# Patient Record
Sex: Female | Born: 1970 | Race: Black or African American | Hispanic: No | Marital: Married | Smoking: Current every day smoker
Health system: Southern US, Community
[De-identification: ages and names within clinical notes are randomized; demographics above are authoritative.]

## PROBLEM LIST (undated history)

## (undated) DIAGNOSIS — K760 Fatty (change of) liver, not elsewhere classified: Secondary | ICD-10-CM

## (undated) DIAGNOSIS — I10 Essential (primary) hypertension: Secondary | ICD-10-CM

## (undated) DIAGNOSIS — K219 Gastro-esophageal reflux disease without esophagitis: Secondary | ICD-10-CM

## (undated) DIAGNOSIS — C801 Malignant (primary) neoplasm, unspecified: Secondary | ICD-10-CM

## (undated) HISTORY — PX: DENTAL SURGERY: SHX609

## (undated) HISTORY — PX: FOOT SURGERY: SHX648

---

## 2000-03-14 ENCOUNTER — Emergency Department (HOSPITAL_COMMUNITY): Admission: EM | Admit: 2000-03-14 | Discharge: 2000-03-14 | Payer: Self-pay | Admitting: Emergency Medicine

## 2000-03-14 ENCOUNTER — Encounter: Payer: Self-pay | Admitting: Emergency Medicine

## 2000-03-19 ENCOUNTER — Other Ambulatory Visit: Admission: RE | Admit: 2000-03-19 | Discharge: 2000-03-19 | Payer: Self-pay | Admitting: Obstetrics

## 2000-03-19 ENCOUNTER — Encounter (INDEPENDENT_AMBULATORY_CARE_PROVIDER_SITE_OTHER): Payer: Self-pay

## 2000-03-28 ENCOUNTER — Emergency Department (HOSPITAL_COMMUNITY): Admission: EM | Admit: 2000-03-28 | Discharge: 2000-03-28 | Payer: Self-pay | Admitting: Emergency Medicine

## 2000-04-13 ENCOUNTER — Emergency Department (HOSPITAL_COMMUNITY): Admission: EM | Admit: 2000-04-13 | Discharge: 2000-04-13 | Payer: Self-pay | Admitting: Emergency Medicine

## 2001-01-03 ENCOUNTER — Emergency Department (HOSPITAL_COMMUNITY): Admission: EM | Admit: 2001-01-03 | Discharge: 2001-01-03 | Payer: Self-pay | Admitting: Emergency Medicine

## 2001-11-13 ENCOUNTER — Emergency Department (HOSPITAL_COMMUNITY): Admission: EM | Admit: 2001-11-13 | Discharge: 2001-11-14 | Payer: Self-pay | Admitting: Emergency Medicine

## 2001-11-14 ENCOUNTER — Encounter: Payer: Self-pay | Admitting: Emergency Medicine

## 2004-05-29 ENCOUNTER — Emergency Department (HOSPITAL_COMMUNITY): Admission: EM | Admit: 2004-05-29 | Discharge: 2004-05-29 | Payer: Self-pay | Admitting: Emergency Medicine

## 2006-03-16 ENCOUNTER — Emergency Department (HOSPITAL_COMMUNITY): Admission: EM | Admit: 2006-03-16 | Discharge: 2006-03-16 | Payer: Self-pay | Admitting: Emergency Medicine

## 2007-02-22 ENCOUNTER — Ambulatory Visit: Payer: Self-pay | Admitting: Family Medicine

## 2007-02-26 ENCOUNTER — Ambulatory Visit: Payer: Self-pay | Admitting: *Deleted

## 2007-03-07 ENCOUNTER — Ambulatory Visit: Payer: Self-pay | Admitting: Family Medicine

## 2007-03-25 ENCOUNTER — Ambulatory Visit: Payer: Self-pay | Admitting: Family Medicine

## 2007-07-15 ENCOUNTER — Ambulatory Visit: Payer: Self-pay | Admitting: Family Medicine

## 2007-07-23 ENCOUNTER — Ambulatory Visit (HOSPITAL_COMMUNITY): Admission: RE | Admit: 2007-07-23 | Discharge: 2007-07-23 | Payer: Self-pay | Admitting: Family Medicine

## 2007-09-09 ENCOUNTER — Ambulatory Visit: Payer: Self-pay | Admitting: Family Medicine

## 2007-09-09 LAB — CONVERTED CEMR LAB
Cholesterol: 244 mg/dL — ABNORMAL HIGH (ref 0–200)
Free T4: 0.94 ng/dL (ref 0.89–1.80)
HDL: 44 mg/dL (ref >39)

## 2007-10-25 ENCOUNTER — Ambulatory Visit: Payer: Self-pay | Admitting: Family Medicine

## 2007-11-04 ENCOUNTER — Ambulatory Visit (HOSPITAL_COMMUNITY): Admission: RE | Admit: 2007-11-04 | Discharge: 2007-11-04 | Payer: Self-pay | Admitting: Family Medicine

## 2007-12-10 ENCOUNTER — Encounter: Admission: RE | Admit: 2007-12-10 | Discharge: 2007-12-10 | Payer: Self-pay | Admitting: Family Medicine

## 2007-12-10 ENCOUNTER — Other Ambulatory Visit: Admission: RE | Admit: 2007-12-10 | Discharge: 2007-12-10 | Payer: Self-pay | Admitting: Diagnostic Radiology

## 2007-12-10 ENCOUNTER — Encounter (INDEPENDENT_AMBULATORY_CARE_PROVIDER_SITE_OTHER): Payer: Self-pay | Admitting: Diagnostic Radiology

## 2007-12-27 ENCOUNTER — Ambulatory Visit: Payer: Self-pay | Admitting: Family Medicine

## 2008-01-28 ENCOUNTER — Encounter (INDEPENDENT_AMBULATORY_CARE_PROVIDER_SITE_OTHER): Payer: Self-pay | Admitting: Family Medicine

## 2008-01-28 ENCOUNTER — Ambulatory Visit: Payer: Self-pay | Admitting: Internal Medicine

## 2008-01-28 LAB — CONVERTED CEMR LAB
CO2: 22 meq/L (ref 19–32)
Calcium: 9.5 mg/dL (ref 8.4–10.5)
Chloride: 105 meq/L (ref 96–112)
Cholesterol: 233 mg/dL — ABNORMAL HIGH (ref 0–200)
Free T4: 0.96 ng/dL (ref 0.89–1.80)
HDL: 34 mg/dL — ABNORMAL LOW (ref >39)
Potassium: 4.3 meq/L (ref 3.5–5.3)
Sodium: 138 meq/L (ref 135–145)
T3 Uptake Ratio: 28.6 % (ref 22.5–37.0)
T4, Total: 9.1 ug/dL (ref 5.0–12.5)

## 2008-02-27 ENCOUNTER — Ambulatory Visit: Payer: Self-pay | Admitting: Internal Medicine

## 2008-04-23 ENCOUNTER — Ambulatory Visit: Payer: Self-pay | Admitting: Internal Medicine

## 2008-04-23 ENCOUNTER — Encounter (INDEPENDENT_AMBULATORY_CARE_PROVIDER_SITE_OTHER): Payer: Self-pay | Admitting: Family Medicine

## 2008-04-23 LAB — CONVERTED CEMR LAB
ALT: 45 units/L — ABNORMAL HIGH (ref 0–35)
Cholesterol: 254 mg/dL — ABNORMAL HIGH (ref 0–200)
Free T4: 1.13 ng/dL (ref 0.89–1.80)
HDL: 38 mg/dL — ABNORMAL LOW (ref >39)
TSH: 0.866 microintl units/mL (ref 0.350–4.50)
Total CHOL/HDL Ratio: 6.7
Triglycerides: 647 mg/dL — ABNORMAL HIGH (ref <150)

## 2008-05-26 ENCOUNTER — Ambulatory Visit: Payer: Self-pay | Admitting: Internal Medicine

## 2008-06-02 ENCOUNTER — Ambulatory Visit: Payer: Self-pay | Admitting: Internal Medicine

## 2008-06-02 ENCOUNTER — Encounter (INDEPENDENT_AMBULATORY_CARE_PROVIDER_SITE_OTHER): Payer: Self-pay | Admitting: *Deleted

## 2008-06-02 LAB — CONVERTED CEMR LAB: Vit D, 1,25-Dihydroxy: 18 — ABNORMAL LOW (ref 30–89)

## 2008-06-08 ENCOUNTER — Ambulatory Visit (HOSPITAL_COMMUNITY): Admission: RE | Admit: 2008-06-08 | Discharge: 2008-06-08 | Payer: Self-pay | Admitting: Family Medicine

## 2008-06-19 ENCOUNTER — Ambulatory Visit: Payer: Self-pay | Admitting: Family Medicine

## 2008-07-23 ENCOUNTER — Ambulatory Visit (HOSPITAL_COMMUNITY): Admission: RE | Admit: 2008-07-23 | Discharge: 2008-07-23 | Payer: Self-pay | Admitting: Family Medicine

## 2008-11-26 ENCOUNTER — Ambulatory Visit: Payer: Self-pay | Admitting: Family Medicine

## 2009-03-02 ENCOUNTER — Ambulatory Visit: Payer: Self-pay | Admitting: Family Medicine

## 2009-03-02 ENCOUNTER — Encounter (INDEPENDENT_AMBULATORY_CARE_PROVIDER_SITE_OTHER): Payer: Self-pay | Admitting: Family Medicine

## 2009-04-06 ENCOUNTER — Ambulatory Visit: Payer: Self-pay | Admitting: Family Medicine

## 2009-04-08 ENCOUNTER — Ambulatory Visit: Payer: Self-pay | Admitting: Internal Medicine

## 2009-04-22 ENCOUNTER — Ambulatory Visit: Payer: Self-pay | Admitting: Family Medicine

## 2009-04-28 ENCOUNTER — Ambulatory Visit: Payer: Self-pay | Admitting: Family Medicine

## 2009-07-26 ENCOUNTER — Ambulatory Visit (HOSPITAL_COMMUNITY): Admission: RE | Admit: 2009-07-26 | Discharge: 2009-07-26 | Payer: Self-pay | Admitting: Family Medicine

## 2009-08-17 ENCOUNTER — Encounter (INDEPENDENT_AMBULATORY_CARE_PROVIDER_SITE_OTHER): Payer: Self-pay | Admitting: Family Medicine

## 2009-08-17 ENCOUNTER — Ambulatory Visit: Payer: Self-pay | Admitting: Internal Medicine

## 2009-08-17 LAB — CONVERTED CEMR LAB
Cholesterol: 267 mg/dL — ABNORMAL HIGH (ref 0–200)
HDL: 44 mg/dL (ref >39)

## 2010-07-27 ENCOUNTER — Ambulatory Visit (HOSPITAL_COMMUNITY): Admission: RE | Admit: 2010-07-27 | Discharge: 2010-07-27 | Payer: Self-pay | Admitting: Internal Medicine

## 2010-09-05 ENCOUNTER — Emergency Department (HOSPITAL_COMMUNITY): Admission: EM | Admit: 2010-09-05 | Discharge: 2010-09-05 | Payer: Self-pay | Admitting: Emergency Medicine

## 2010-11-06 ENCOUNTER — Encounter: Payer: Self-pay | Admitting: Family Medicine

## 2010-12-07 ENCOUNTER — Ambulatory Visit: Payer: Medicaid Other | Attending: Rehabilitation | Admitting: Rehabilitation

## 2010-12-07 DIAGNOSIS — M545 Low back pain, unspecified: Secondary | ICD-10-CM | POA: Insufficient documentation

## 2010-12-07 DIAGNOSIS — R5381 Other malaise: Secondary | ICD-10-CM | POA: Insufficient documentation

## 2010-12-07 DIAGNOSIS — R293 Abnormal posture: Secondary | ICD-10-CM | POA: Insufficient documentation

## 2010-12-07 DIAGNOSIS — IMO0001 Reserved for inherently not codable concepts without codable children: Secondary | ICD-10-CM | POA: Insufficient documentation

## 2010-12-14 ENCOUNTER — Encounter: Payer: Medicaid Other | Admitting: Rehabilitation

## 2010-12-20 ENCOUNTER — Ambulatory Visit: Payer: Medicaid Other | Attending: Rehabilitation | Admitting: Rehabilitation

## 2010-12-20 DIAGNOSIS — IMO0001 Reserved for inherently not codable concepts without codable children: Secondary | ICD-10-CM | POA: Insufficient documentation

## 2010-12-20 DIAGNOSIS — R5381 Other malaise: Secondary | ICD-10-CM | POA: Insufficient documentation

## 2010-12-20 DIAGNOSIS — M545 Low back pain, unspecified: Secondary | ICD-10-CM | POA: Insufficient documentation

## 2010-12-20 DIAGNOSIS — R293 Abnormal posture: Secondary | ICD-10-CM | POA: Insufficient documentation

## 2010-12-27 ENCOUNTER — Ambulatory Visit: Payer: Medicaid Other | Admitting: Rehabilitation

## 2011-01-04 ENCOUNTER — Encounter: Payer: Medicaid Other | Admitting: Rehabilitation

## 2011-01-11 ENCOUNTER — Ambulatory Visit: Payer: Medicaid Other | Admitting: Rehabilitation

## 2011-01-24 ENCOUNTER — Encounter: Payer: Medicaid Other | Admitting: Rehabilitation

## 2011-08-07 ENCOUNTER — Other Ambulatory Visit (HOSPITAL_COMMUNITY): Payer: Self-pay | Admitting: Internal Medicine

## 2011-08-07 DIAGNOSIS — Z1231 Encounter for screening mammogram for malignant neoplasm of breast: Secondary | ICD-10-CM

## 2011-08-25 ENCOUNTER — Ambulatory Visit (HOSPITAL_COMMUNITY): Payer: Medicaid Other | Attending: Internal Medicine

## 2011-09-19 ENCOUNTER — Other Ambulatory Visit: Payer: Self-pay | Admitting: Rehabilitation

## 2011-09-19 DIAGNOSIS — M545 Low back pain, unspecified: Secondary | ICD-10-CM

## 2011-09-23 ENCOUNTER — Ambulatory Visit
Admission: RE | Admit: 2011-09-23 | Discharge: 2011-09-23 | Disposition: A | Discharge status: Home / Self Care | Payer: Medicaid Other | Source: Ambulatory Visit | Attending: Rehabilitation | Admitting: Rehabilitation

## 2011-09-23 DIAGNOSIS — M545 Low back pain: Secondary | ICD-10-CM

## 2011-11-06 ENCOUNTER — Encounter (HOSPITAL_COMMUNITY): Payer: Self-pay | Admitting: *Deleted

## 2011-11-06 ENCOUNTER — Emergency Department (HOSPITAL_COMMUNITY)
Admission: EM | Admit: 2011-11-06 | Discharge: 2011-11-07 | Disposition: A | Discharge status: Home / Self Care | Payer: No Typology Code available for payment source | Attending: Emergency Medicine | Admitting: Emergency Medicine

## 2011-11-06 ENCOUNTER — Emergency Department (HOSPITAL_COMMUNITY): Payer: No Typology Code available for payment source

## 2011-11-06 DIAGNOSIS — T148XXA Other injury of unspecified body region, initial encounter: Secondary | ICD-10-CM

## 2011-11-06 DIAGNOSIS — S161XXA Strain of muscle, fascia and tendon at neck level, initial encounter: Secondary | ICD-10-CM

## 2011-11-06 DIAGNOSIS — E119 Type 2 diabetes mellitus without complications: Secondary | ICD-10-CM | POA: Insufficient documentation

## 2011-11-06 DIAGNOSIS — S139XXA Sprain of joints and ligaments of unspecified parts of neck, initial encounter: Secondary | ICD-10-CM | POA: Insufficient documentation

## 2011-11-06 DIAGNOSIS — M546 Pain in thoracic spine: Secondary | ICD-10-CM | POA: Insufficient documentation

## 2011-11-06 DIAGNOSIS — M25519 Pain in unspecified shoulder: Secondary | ICD-10-CM | POA: Insufficient documentation

## 2011-11-06 DIAGNOSIS — Z79899 Other long term (current) drug therapy: Secondary | ICD-10-CM | POA: Insufficient documentation

## 2011-11-06 DIAGNOSIS — M542 Cervicalgia: Secondary | ICD-10-CM | POA: Insufficient documentation

## 2011-11-06 DIAGNOSIS — I10 Essential (primary) hypertension: Secondary | ICD-10-CM | POA: Insufficient documentation

## 2011-11-06 HISTORY — DX: Essential (primary) hypertension: I10
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
## 2011-11-07 MED ORDER — CYCLOBENZAPRINE HCL 10 MG PO TABS: 10.0000 mg | ORAL_TABLET | Freq: Three times a day (TID) | ORAL | Status: AC | PRN

## 2011-11-07 NOTE — ED Provider Notes (Signed)
Medical screening examination/treatment/procedure(s) were performed by non-physician practitioner and as supervising physician I was immediately available for consultation/collaboration.   Kember Boch D Priscella Donna, MD 11/07/11 2315 

## 2011-11-07 NOTE — ED Notes (Signed)
Pt. Alert and oriented, ambulatory, gait steady, NAD noted,

## 2012-01-25 ENCOUNTER — Ambulatory Visit (HOSPITAL_COMMUNITY)
Admission: RE | Admit: 2012-01-25 | Discharge: 2012-01-25 | Disposition: A | Discharge status: Home / Self Care | Payer: Medicaid Other | Source: Ambulatory Visit | Attending: Internal Medicine | Admitting: Internal Medicine

## 2012-01-25 DIAGNOSIS — Z1231 Encounter for screening mammogram for malignant neoplasm of breast: Secondary | ICD-10-CM | POA: Insufficient documentation

## 2012-02-05 ENCOUNTER — Other Ambulatory Visit: Payer: Self-pay | Admitting: Internal Medicine

## 2012-02-09 ENCOUNTER — Other Ambulatory Visit: Payer: No Typology Code available for payment source

## 2012-03-04 ENCOUNTER — Ambulatory Visit
Admission: RE | Admit: 2012-03-04 | Discharge: 2012-03-04 | Disposition: A | Discharge status: Home / Self Care | Payer: Medicaid Other | Source: Ambulatory Visit | Attending: Internal Medicine | Admitting: Internal Medicine

## 2012-08-01 ENCOUNTER — Other Ambulatory Visit: Payer: Self-pay | Admitting: Internal Medicine

## 2012-08-01 DIAGNOSIS — K3189 Other diseases of stomach and duodenum: Secondary | ICD-10-CM

## 2012-08-07 ENCOUNTER — Ambulatory Visit
Admission: RE | Admit: 2012-08-07 | Discharge: 2012-08-07 | Disposition: A | Discharge status: Home / Self Care | Payer: Medicaid Other | Source: Ambulatory Visit | Attending: Internal Medicine | Admitting: Internal Medicine

## 2012-08-07 DIAGNOSIS — K3189 Other diseases of stomach and duodenum: Secondary | ICD-10-CM

## 2012-12-15 ENCOUNTER — Emergency Department (HOSPITAL_COMMUNITY): Payer: Medicaid Other

## 2012-12-15 ENCOUNTER — Emergency Department (HOSPITAL_COMMUNITY)
Admission: EM | Admit: 2012-12-15 | Discharge: 2012-12-15 | Disposition: A | Discharge status: Home / Self Care | Payer: Medicaid Other | Attending: Emergency Medicine | Admitting: Emergency Medicine

## 2012-12-15 ENCOUNTER — Encounter (HOSPITAL_COMMUNITY): Payer: Self-pay | Admitting: *Deleted

## 2012-12-15 DIAGNOSIS — R42 Dizziness and giddiness: Secondary | ICD-10-CM | POA: Insufficient documentation

## 2012-12-15 DIAGNOSIS — I10 Essential (primary) hypertension: Secondary | ICD-10-CM | POA: Insufficient documentation

## 2012-12-15 DIAGNOSIS — R1013 Epigastric pain: Secondary | ICD-10-CM | POA: Insufficient documentation

## 2012-12-15 DIAGNOSIS — Z8619 Personal history of other infectious and parasitic diseases: Secondary | ICD-10-CM | POA: Insufficient documentation

## 2012-12-15 DIAGNOSIS — R000 Tachycardia, unspecified: Secondary | ICD-10-CM | POA: Insufficient documentation

## 2012-12-15 DIAGNOSIS — K219 Gastro-esophageal reflux disease without esophagitis: Secondary | ICD-10-CM | POA: Insufficient documentation

## 2012-12-15 DIAGNOSIS — R112 Nausea with vomiting, unspecified: Secondary | ICD-10-CM | POA: Insufficient documentation

## 2012-12-15 DIAGNOSIS — Z79899 Other long term (current) drug therapy: Secondary | ICD-10-CM | POA: Insufficient documentation

## 2012-12-15 DIAGNOSIS — R1110 Vomiting, unspecified: Secondary | ICD-10-CM

## 2012-12-15 DIAGNOSIS — Z8719 Personal history of other diseases of the digestive system: Secondary | ICD-10-CM | POA: Insufficient documentation

## 2012-12-15 DIAGNOSIS — R197 Diarrhea, unspecified: Secondary | ICD-10-CM | POA: Insufficient documentation

## 2012-12-15 DIAGNOSIS — R51 Headache: Secondary | ICD-10-CM | POA: Insufficient documentation

## 2012-12-15 HISTORY — DX: Gastro-esophageal reflux disease without esophagitis: K21.9

## 2012-12-15 HISTORY — DX: Fatty (change of) liver, not elsewhere classified: K76.0

## 2012-12-15 LAB — URINALYSIS, ROUTINE W REFLEX MICROSCOPIC
Ketones, ur: NEGATIVE mg/dL
Nitrite: NEGATIVE
Protein, ur: NEGATIVE mg/dL
Urobilinogen, UA: 0.2 mg/dL (ref 0.0–1.0)

## 2012-12-15 LAB — POCT I-STAT, CHEM 8
Calcium, Ion: 1.17 mmol/L (ref 1.12–1.23)
Glucose, Bld: 200 mg/dL — ABNORMAL HIGH (ref 70–99)
Sodium: 139 mEq/L (ref 135–145)

## 2012-12-15 LAB — COMPREHENSIVE METABOLIC PANEL
ALT: 56 U/L — ABNORMAL HIGH (ref 0–35)
AST: 53 U/L — ABNORMAL HIGH (ref 0–37)
CO2: 22 mEq/L (ref 19–32)
Calcium: 9.6 mg/dL (ref 8.4–10.5)
Chloride: 101 mEq/L (ref 96–112)
GFR calc non Af Amer: >90 mL/min (ref >90)
Potassium: 3.7 mEq/L (ref 3.5–5.1)
Sodium: 137 mEq/L (ref 135–145)
Total Bilirubin: 0.7 mg/dL (ref 0.3–1.2)

## 2012-12-15 LAB — CBC WITH DIFFERENTIAL/PLATELET
Eosinophils Relative: 0 % (ref 0–5)
Lymphocytes Relative: 11 % — ABNORMAL LOW (ref 12–46)
Monocytes Relative: 4 % (ref 3–12)
Neutrophils Relative %: 85 % — ABNORMAL HIGH (ref 43–77)
Platelets: 229 10*3/uL (ref 150–400)
RBC: 5.27 MIL/uL — ABNORMAL HIGH (ref 3.87–5.11)
WBC: 10.4 10*3/uL (ref 4.0–10.5)

## 2012-12-15 MED ORDER — ONDANSETRON HCL 4 MG PO TABS: 4.0000 mg | ORAL_TABLET | Freq: Three times a day (TID) | ORAL | Status: DC | PRN

## 2012-12-15 MED ORDER — MORPHINE SULFATE 4 MG/ML IJ SOLN
4.0000 mg | Freq: Once | INTRAMUSCULAR | Status: AC
  Administered 2012-12-15: 4 mg via INTRAVENOUS
  Filled 2012-12-15: qty 1

## 2012-12-15 MED ORDER — ONDANSETRON 4 MG PO TBDP
4.0000 mg | ORAL_TABLET | Freq: Once | ORAL | Status: AC
  Administered 2012-12-15: 4 mg via ORAL
  Filled 2012-12-15: qty 1

## 2012-12-15 MED ORDER — SODIUM CHLORIDE 0.9 % IV BOLUS (SEPSIS)
1000.0000 mL | Freq: Once | INTRAVENOUS | Status: AC
  Administered 2012-12-15: 1000 mL via INTRAVENOUS

## 2012-12-15 NOTE — ED Notes (Signed)
Pt reports that she started vomiting and having diarrhea this morning.  She also reports abdominal pain.  She is a type 2 diabetic.  She is also concerned that she could have a kidney infection.  Pt was started on medication for H pyloris and the symptoms started shortly after she took the medication.

## 2012-12-15 NOTE — ED Notes (Signed)
Pt checked children into pediatric dept and will come back to be seen after her children are seen

## 2012-12-15 NOTE — ED Provider Notes (Signed)
History  This chart was scribed for non-physician practitioner working with Ethelda Chick, MD by Erskine Emery, ED Scribe. This patient was seen in room TR09C/TR09C and the patient's care was started at 18:05.   CSN: [id number]  Arrival date & time 12/15/12  1550   First MD Initiated Contact with Patient 12/15/12 1625      Chief Complaint  Patient presents with  . Abdominal Pain  . Emesis  . Diarrhea    (Consider location/radiation/quality/duration/timing/severity/associated sxs/prior treatment) The history is provided by the patient. No language interpreter was used.  Hayley Collins is a 42 y.o. female who presents to the Emergency Department complaining of emesis (3 episodes) and diarrhea (about 3 episodes) since this morning. Pt reports some associated nausea, epigastric abdominal pain, dizziness, lightheadedness, and frontal headache. Pt claims she is in about 11-12/10 pain currently. Pt's entire family has been sick with the same stomach virus this week. Pt was started on a new medication for H pylori about 1.5 weeks ago. Just a couple days ago she missed 24 hours of her medication, took one dose again, then 30 minutes later started having symptoms, which she has had ever since. Pt is a type II diabetic and is concerned about a kidney infection. She has not been taking her metformin for the past 3 weeks. Pt tested for H pylori by Dr. Ronaldo Miyamoto in November but did not find out that she had been diagnosed with it until she saw her PCP for a routine check up 1.5 weeks ago. Pt has also been diagnosed with HTN, GERD, and a fatty liver.   Past Medical History  Diagnosis Date  . Diabetes mellitus   . Hypertension   . GERD (gastroesophageal reflux disease)   . Fatty liver     History reviewed. No pertinent past surgical history.  History reviewed. No pertinent family history.  History  Substance Use Topics  . Smoking status: Never Smoker   . Smokeless tobacco: Not on file  .  Alcohol Use: No    OB History   Grav Para Term Preterm Abortions TAB SAB Ect Mult Living                  Review of Systems  Constitutional: Negative for fever and diaphoresis.  HENT: Negative for neck pain and neck stiffness.   Eyes: Negative for visual disturbance.  Respiratory: Negative for apnea and chest tightness.   Cardiovascular: Negative for palpitations.  Gastrointestinal: Positive for nausea, vomiting, abdominal pain and diarrhea.       Diffuse epigastric  Endocrine: Negative for polydipsia, polyphagia and polyuria.  Genitourinary: Negative for dysuria, hematuria, flank pain, decreased urine volume, vaginal discharge, difficulty urinating and pelvic pain.  Musculoskeletal: Negative for gait problem.  Skin: Negative for rash.  Neurological: Positive for dizziness, light-headedness and headaches. Negative for weakness and numbness.  All other systems reviewed and are negative.    Allergies  Review of patient's allergies indicates no known allergies.  Home Medications   Current Outpatient Rx  Name  Route  Sig  Dispense  Refill  . amoxicillin-clarithromycin-lansoprazole  [facility]) combo pack   Oral   Take 1 each by mouth 2 (two) times daily. Follow package directions.         . cholecalciferol (VITAMIN D) 1000 UNITS tablet   Oral   Take 1,000 Units by mouth daily.         . fish oil-omega-3 fatty acids 1000 MG capsule   Oral   Take 1  g by mouth daily.         Marland Kitchen gemfibrozil (LOPID) 600 MG tablet   Oral   Take 600 mg by mouth daily.         Marland Kitchen lisinopril (PRINIVIL,ZESTRIL) 5 MG tablet   Oral   Take 5 mg by mouth daily.         . metFORMIN (GLUCOPHAGE) 500 MG tablet   Oral   Take 1,000 mg by mouth daily with breakfast.         . omeprazole (PRILOSEC) 20 MG capsule   Oral   Take 20 mg by mouth daily.           Triage Vitals: BP 142/83  Pulse 112  Temp(Src) 98.4 F (36.9 C)  Resp 20  SpO2 100%  Physical Exam  Nursing note and vitals  reviewed. Constitutional: She is oriented to person, place, and time. She appears well-developed and well-nourished. No distress.  HENT:  Head: Normocephalic and atraumatic.  Eyes: Conjunctivae and EOM are normal.  No icterus  Neck: Normal range of motion. Neck supple.  No meningeal signs  Cardiovascular: Regular rhythm, normal heart sounds and intact distal pulses.  Tachycardia present.  Exam reveals no gallop and no friction rub.   No murmur heard. Pulmonary/Chest: Effort normal and breath sounds normal. No respiratory distress. She has no wheezes. She has no rales. She exhibits no tenderness.  Borderline tachypnea.   Abdominal: Soft. Bowel sounds are normal. She exhibits no distension. There is tenderness in the epigastric area. There is no rebound, no guarding, no tenderness at McBurney's point and negative Murphy's sign.  Diffuse epigastric tenderness. Not worsened by deep palplation  Genitourinary:  No CVA tenderness  Musculoskeletal: Normal range of motion. She exhibits no edema and no tenderness.  Neurological: She is alert and oriented to person, place, and time. No cranial nerve deficit.  Skin: Skin is warm and dry. She is not diaphoretic. No erythema.    ED Course  Procedures (including critical care time) DIAGNOSTIC STUDIES: Oxygen Saturation is 100% on room air, normal by my interpretation.    COORDINATION OF CARE: 18:05--I evaluated the patient and we discussed a treatment plan including antiemetics and consult with another physician to which the pt agreed. I notified the pt that she is tachycardiac, tachypneic, and has a urinary glucose level over 1000.  22:16--I rechecked the pt and discussed with her the results of her labs. I explained to her the importance of following up with her PCP.  Results for orders placed during the hospital encounter of 12/15/12  URINALYSIS, ROUTINE W REFLEX MICROSCOPIC      Result Value Range   Color, Urine YELLOW  YELLOW   APPearance  CLEAR  CLEAR   Specific Gravity, Urine 1.035 (*) 1.005 - 1.030   pH 5.0  5.0 - 8.0   Glucose, UA >1000 (*) NEGATIVE mg/dL   Hgb urine dipstick NEGATIVE  NEGATIVE   Bilirubin Urine NEGATIVE  NEGATIVE   Ketones, ur NEGATIVE  NEGATIVE mg/dL   Protein, ur NEGATIVE  NEGATIVE mg/dL   Urobilinogen, UA 0.2  0.0 - 1.0 mg/dL   Nitrite NEGATIVE  NEGATIVE   Leukocytes, UA NEGATIVE  NEGATIVE  URINE MICROSCOPIC-ADD ON      Result Value Range   Urine-Other       Value: NO FORMED ELEMENTS SEEN ON URINE MICROSCOPIC EXAMINATION  CBC WITH DIFFERENTIAL      Result Value Range   WBC 10.4  4.0 - 10.5 K/uL  RBC 5.27 (*) 3.87 - 5.11 MIL/uL   Hemoglobin 12.7  12.0 - 15.0 g/dL   HCT 16.1  09.6 - 04.5 %   MCV 72.3 (*) 78.0 - 100.0 fL   MCH 24.1 (*) 26.0 - 34.0 pg   MCHC 33.3  30.0 - 36.0 g/dL   RDW 40.9  81.1 - 91.4 %   Platelets 229  150 - 400 K/uL   Neutrophils Relative 85 (*) 43 - 77 %   Lymphocytes Relative 11 (*) 12 - 46 %   Monocytes Relative 4  3 - 12 %   Eosinophils Relative 0  0 - 5 %   Basophils Relative 0  0 - 1 %   Neutro Abs 8.9 (*) 1.7 - 7.7 K/uL   Lymphs Abs 1.1  0.7 - 4.0 K/uL   Monocytes Absolute 0.4  0.1 - 1.0 K/uL   Eosinophils Absolute 0.0  0.0 - 0.7 K/uL   Basophils Absolute 0.0  0.0 - 0.1 K/uL   RBC Morphology POLYCHROMASIA PRESENT    COMPREHENSIVE METABOLIC PANEL      Result Value Range   Sodium 137  135 - 145 mEq/L   Potassium 3.7  3.5 - 5.1 mEq/L   Chloride 101  96 - 112 mEq/L   CO2 22  19 - 32 mEq/L   Glucose, Bld 184 (*) 70 - 99 mg/dL   BUN 12  6 - 23 mg/dL   Creatinine, Ser 7.82  0.50 - 1.10 mg/dL   Calcium 9.6  8.4 - 95.6 mg/dL   Total Protein 8.2  6.0 - 8.3 g/dL   Albumin 3.7  3.5 - 5.2 g/dL   AST 53 (*) 0 - 37 U/L   ALT 56 (*) 0 - 35 U/L   Alkaline Phosphatase 119 (*) 39 - 117 U/L   Total Bilirubin 0.7  0.3 - 1.2 mg/dL   GFR calc non Af Amer >90  >90 mL/min   GFR calc Af Amer >90  >90 mL/min  LIPASE, BLOOD      Result Value Range   Lipase 53  11 - 59 U/L   POCT I-STAT, CHEM 8      Result Value Range   Sodium 139  135 - 145 mEq/L   Potassium 3.8  3.5 - 5.1 mEq/L   Chloride 104  96 - 112 mEq/L   BUN 12  6 - 23 mg/dL   Creatinine, Ser 2.13  0.50 - 1.10 mg/dL   Glucose, Bld 086 (*) 70 - 99 mg/dL   Calcium, Ion 5.78  4.69 - 1.23 mmol/L   TCO2 26  0 - 100 mmol/L   Hemoglobin 15.6 (*) 12.0 - 15.0 g/dL   HCT 62.9  52.8 - 41.3 %   US Abdomen Complete  12/15/2012  *RADIOLOGY REPORT*  Clinical Data:  Abdominal pain and elevated LFTs.  Nausea, vomiting and diarrhea.  ABDOMINAL ULTRASOUND COMPLETE  Comparison:  Abdominal ultrasound performed 08/07/2012  Findings:  Gallbladder:  The gallbladder is normal in appearance, without evidence for gallstones, gallbladder wall thickening or pericholecystic fluid.  No ultrasonographic Murphy's sign is elicited.  Common Bile Duct:  0.6 cm in diameter; within normal limits in caliber.  Liver:  Increased parenchymal echogenicity and coarsened echotexture, compatible with fatty infiltration; no focal lesions identified.  Limited Doppler evaluation demonstrates normal blood flow within the liver.  IVC:  Unremarkable in appearance.  Pancreas:  Although the pancreas is difficult to visualize in its entirety due to overlying bowel gas, no focal  pancreatic abnormality is identified.  Spleen:  9.7 cm in length; within normal limits in size and echotexture.  Right kidney:  12.8 cm in length; normal in size, configuration and parenchymal echogenicity.  No evidence of mass or hydronephrosis.  Left kidney:  12.3 cm in length; normal in size, configuration and parenchymal echogenicity.  No evidence of mass or hydronephrosis.  Abdominal Aorta:  Normal in caliber; no aneurysm identified. Not visualized distally due to overlying bowel gas.  IMPRESSION:  1.  No acute abnormality seen within the abdomen. 2.  Diffuse fatty infiltration within the liver.   Original Report Authenticated By: Tonia Ghent, M.D.       Diagnosis: emesis    MDM   Pt was sent back from peds where she had brought her children who were diagnosed with gastroenteritis and discharged. Pt believed she had the same symptoms. In fast track, pt was found to be tachycardic, borderline tachypnic, with >1000 glucose in her urine (serum glucose of 200) and hx of non compliant DM2 and fatty liver. Consulted Dr. Karma Ganja for consideration of acute care. Advised to continue care in FT.  Will get labs, provide IVF, pain meds, and anti-emetics to keep pt comfortable while awaiting results.  Return of elevated liver enzymes, will get abdominal ultrasound which showed already documented fatty liver, but otherwise no acute abnormality.   On re-evaluation, patient is feeling better, nontoxic, nonseptic appearing, in no apparent distress.  Patient's pain and other symptoms adequately managed in emergency department.  Fluid bolus given.  Labs, imaging and vitals reviewed.  Patient does not meet the SIRS or Sepsis criteria.  On repeat exam patient does not have a surgical abdomen and there are nor peritoneal signs.  No indication of appendicitis, bowel obstruction, bowel perforation, cholecystitis, diverticulitis, PID or ectopic pregnancy.    Patient discharged home with symptomatic treatment and given strict instructions for follow-up with on call doctor regarding liver enzymes and provided resource guide for primary care.  I have also discussed reasons to return immediately to the ER.  Patient expresses understanding and agrees with plan.   I personally performed the services described in this documentation, which was scribed in my presence. The recorded information has been reviewed and is accurate.    Glade Nurse, PA-C 12/17/12 1424

## 2012-12-17 NOTE — ED Provider Notes (Signed)
Medical screening examination/treatment/procedure(s) were performed by non-physician practitioner and as supervising physician I was immediately available for consultation/collaboration.  Ethelda Chick, MD 12/17/12 [phone]

## 2013-03-27 ENCOUNTER — Other Ambulatory Visit (HOSPITAL_COMMUNITY): Payer: Self-pay | Admitting: Internal Medicine

## 2013-03-27 DIAGNOSIS — Z1231 Encounter for screening mammogram for malignant neoplasm of breast: Secondary | ICD-10-CM

## 2013-04-09 ENCOUNTER — Ambulatory Visit (HOSPITAL_COMMUNITY): Payer: Medicaid Other | Attending: Internal Medicine

## 2013-05-02 ENCOUNTER — Ambulatory Visit (HOSPITAL_COMMUNITY)
Admission: RE | Admit: 2013-05-02 | Discharge: 2013-05-02 | Disposition: A | Discharge status: Home / Self Care | Payer: Medicaid Other | Source: Ambulatory Visit | Attending: Internal Medicine | Admitting: Internal Medicine

## 2013-05-02 DIAGNOSIS — Z1231 Encounter for screening mammogram for malignant neoplasm of breast: Secondary | ICD-10-CM

## 2013-05-10 ENCOUNTER — Emergency Department (HOSPITAL_COMMUNITY): Payer: Medicaid Other

## 2013-05-10 ENCOUNTER — Emergency Department (HOSPITAL_COMMUNITY)
Admission: EM | Admit: 2013-05-10 | Discharge: 2013-05-10 | Disposition: A | Discharge status: Home / Self Care | Payer: Medicaid Other | Attending: Emergency Medicine | Admitting: Emergency Medicine

## 2013-05-10 ENCOUNTER — Encounter (HOSPITAL_COMMUNITY): Payer: Self-pay | Admitting: *Deleted

## 2013-05-10 DIAGNOSIS — E119 Type 2 diabetes mellitus without complications: Secondary | ICD-10-CM | POA: Insufficient documentation

## 2013-05-10 DIAGNOSIS — I10 Essential (primary) hypertension: Secondary | ICD-10-CM | POA: Insufficient documentation

## 2013-05-10 DIAGNOSIS — Z3202 Encounter for pregnancy test, result negative: Secondary | ICD-10-CM | POA: Insufficient documentation

## 2013-05-10 DIAGNOSIS — Z79899 Other long term (current) drug therapy: Secondary | ICD-10-CM | POA: Insufficient documentation

## 2013-05-10 DIAGNOSIS — K219 Gastro-esophageal reflux disease without esophagitis: Secondary | ICD-10-CM | POA: Insufficient documentation

## 2013-05-10 DIAGNOSIS — Z8719 Personal history of other diseases of the digestive system: Secondary | ICD-10-CM | POA: Insufficient documentation

## 2013-05-10 DIAGNOSIS — R531 Weakness: Secondary | ICD-10-CM

## 2013-05-10 DIAGNOSIS — R0789 Other chest pain: Secondary | ICD-10-CM | POA: Insufficient documentation

## 2013-05-10 DIAGNOSIS — R0602 Shortness of breath: Secondary | ICD-10-CM | POA: Insufficient documentation

## 2013-05-10 DIAGNOSIS — R42 Dizziness and giddiness: Secondary | ICD-10-CM | POA: Insufficient documentation

## 2013-05-10 DIAGNOSIS — R5381 Other malaise: Secondary | ICD-10-CM | POA: Insufficient documentation

## 2013-05-10 LAB — CBC WITH DIFFERENTIAL/PLATELET
Basophils Absolute: 0 10*3/uL (ref 0.0–0.1)
Eosinophils Relative: 1 % (ref 0–5)
HCT: 38.9 % (ref 36.0–46.0)
Lymphs Abs: 3 10*3/uL (ref 0.7–4.0)
MCV: 72.6 fL — ABNORMAL LOW (ref 78.0–100.0)
Monocytes Relative: 7 % (ref 3–12)
Neutro Abs: 3.7 10*3/uL (ref 1.7–7.7)
RDW: 14.9 % (ref 11.5–15.5)
WBC: 7.3 10*3/uL (ref 4.0–10.5)

## 2013-05-10 LAB — URINALYSIS, ROUTINE W REFLEX MICROSCOPIC
Leukocytes, UA: NEGATIVE
Protein, ur: NEGATIVE mg/dL
Urobilinogen, UA: 0.2 mg/dL (ref 0.0–1.0)

## 2013-05-10 LAB — COMPREHENSIVE METABOLIC PANEL
BUN: 13 mg/dL (ref 6–23)
CO2: 25 mEq/L (ref 19–32)
Chloride: 99 mEq/L (ref 96–112)
Creatinine, Ser: 0.62 mg/dL (ref 0.50–1.10)
GFR calc Af Amer: >90 mL/min (ref >90)
GFR calc non Af Amer: >90 mL/min (ref >90)
Total Bilirubin: 0.3 mg/dL (ref 0.3–1.2)

## 2013-05-10 LAB — URINE MICROSCOPIC-ADD ON

## 2013-05-10 LAB — POCT I-STAT TROPONIN I

## 2013-05-10 LAB — PREGNANCY, URINE: Preg Test, Ur: NEGATIVE

## 2013-05-10 MED ORDER — SODIUM CHLORIDE 0.9 % IV BOLUS (SEPSIS): 500.0000 mL | Freq: Once | INTRAVENOUS | Status: DC

## 2013-05-10 MED ORDER — SODIUM CHLORIDE 0.9 % IV BOLUS (SEPSIS)
2000.0000 mL | Freq: Once | INTRAVENOUS | Status: AC
  Administered 2013-05-10: 2000 mL via INTRAVENOUS

## 2013-05-10 NOTE — ED Provider Notes (Signed)
CSN: [id number]     Arrival date & time 05/10/13  1519 History     First MD Initiated Contact with Patient 05/10/13 1554     Chief Complaint  Patient presents with  . Shortness of Breath   (Consider location/radiation/quality/duration/timing/severity/associated sxs/prior Treatment) HPI This 42 year old female complains of several days gradual onset constant 24-hour a day feeling of generalized weakness generalized fatigue and lightheadedness when she stands up as well as mild shortness of breath 24 hours a day not changing with position or activity, she is no fever no cough, she is a constant anterior localized chest wall tenderness 24 hours a day for the last several days as well without abdominal pain without radiation without nausea vomiting diarrhea or bloody stools, she is no fever no rash no trauma no edema, no treatment prior to arrival, she is stable unchanged chronic low back pain radiating to her left thigh with left lateral thigh numbness for several years unchanged recently, she is no recent trauma or immobilization. PERC negative. Past Medical History  Diagnosis Date  . Diabetes mellitus   . Hypertension   . GERD (gastroesophageal reflux disease)   . Fatty liver    History reviewed. No pertinent past surgical history. No family history on file. History  Substance Use Topics  . Smoking status: Never Smoker   . Smokeless tobacco: Not on file  . Alcohol Use: No   OB History   Grav Para Term Preterm Abortions TAB SAB Ect Mult Living                 Review of Systems 10 Systems reviewed and are negative for acute change except as noted in the HPI. Allergies  Review of patient's allergies indicates no known allergies.  Home Medications   Current Outpatient Rx  Name  Route  Sig  Dispense  Refill  . lisinopril (PRINIVIL,ZESTRIL) 5 MG tablet   Oral   Take 5 mg by mouth daily.         . metFORMIN (GLUCOPHAGE) 500 MG tablet   Oral   Take 1,000 mg by mouth daily  with breakfast.         . omeprazole (PRILOSEC) 20 MG capsule   Oral   Take 20 mg by mouth daily.          BP 146/81  Pulse 74  Temp(Src) 98.1 F (36.7 C) (Oral)  Resp 16  SpO2 100%  LMP 05/08/2013 Physical Exam  Nursing note and vitals reviewed. Constitutional:  Awake, alert, nontoxic appearance.  HENT:  Head: Atraumatic.  Eyes: Right eye exhibits no discharge. Left eye exhibits no discharge.  Neck: Neck supple.  Cardiovascular: Normal rate and regular rhythm.   No murmur heard. Pulmonary/Chest: Effort normal and breath sounds normal. No respiratory distress. She has no wheezes. She has no rales. She exhibits no tenderness.  Abdominal: Soft. There is no tenderness. There is no rebound.  Musculoskeletal: She exhibits tenderness. She exhibits no edema.  Baseline ROM, no obvious new focal weakness. Baseline mild diffuse lumbar and paralumbar tenderness, baseline decreased light touch left lateral thigh chronic.  Neurological: She is alert.  Mental status and motor strength appears baseline for patient and situation.  Skin: No rash noted.  Psychiatric: She has a normal mood and affect.    ED Course  ECG: Sinus rhythm, ventricular rate 71, normal axis, normal intervals no ischemic ischemic changes noted, impression normal ECG, no comparison ECG available  Pt stable in ED with no significant deterioration in  condition.Patient informed of clinical course, understand medical decision-making process, and agree with plan. Procedures (including critical care time)  Labs Reviewed  CBC WITH DIFFERENTIAL - Abnormal; Notable for the following:    RBC 5.36 (*)    MCV 72.6 (*)    MCH 23.5 (*)    All other components within normal limits  COMPREHENSIVE METABOLIC PANEL - Abnormal; Notable for the following:    Glucose, Bld 226 (*)    All other components within normal limits  URINALYSIS, ROUTINE W REFLEX MICROSCOPIC - Abnormal; Notable for the following:    Specific Gravity, Urine  1.040 (*)    Glucose, UA >1000 (*)    Hgb urine dipstick LARGE (*)    All other components within normal limits  CG4 I-STAT (LACTIC ACID) - Abnormal; Notable for the following:    Lactic Acid, Venous 2.48 (*)    All other components within normal limits  PRO B NATRIURETIC PEPTIDE  URINE MICROSCOPIC-ADD ON  PREGNANCY, URINE  POCT I-STAT TROPONIN I   Dg Chest 2 View  05/10/2013   *RADIOLOGY REPORT*  Clinical Data: Shortness of breath  CHEST - 2 VIEW  Comparison: None.  Findings: Lungs clear.  Heart size and pulmonary vascularity are normal.  No adenopathy.  There is mild degenerative change in the thoracic spine.  IMPRESSION: No edema or consolidation.   Original Report Authenticated By: Bretta Bang, M.D.   1. Generalized weakness     MDM  I doubt any other EMC precluding discharge at this time including, but not necessarily limited to the following:PE, ACS, SBI.  Hurman Horn, MD 05/11/13 1154

## 2013-05-10 NOTE — ED Notes (Signed)
Pt reports SOB x 4 days; pt reports feeling exhausted and week; denies any chest pain, reports feeling dizzy at times

## 2014-02-10 ENCOUNTER — Emergency Department (HOSPITAL_COMMUNITY): Payer: Medicaid Other

## 2014-02-10 ENCOUNTER — Encounter (HOSPITAL_COMMUNITY): Payer: Self-pay | Admitting: Emergency Medicine

## 2014-02-10 ENCOUNTER — Emergency Department (HOSPITAL_COMMUNITY)
Admission: EM | Admit: 2014-02-10 | Discharge: 2014-02-11 | Disposition: A | Discharge status: Home / Self Care | Payer: Medicaid Other | Attending: Emergency Medicine | Admitting: Emergency Medicine

## 2014-02-10 DIAGNOSIS — Z79899 Other long term (current) drug therapy: Secondary | ICD-10-CM | POA: Insufficient documentation

## 2014-02-10 DIAGNOSIS — IMO0002 Reserved for concepts with insufficient information to code with codable children: Secondary | ICD-10-CM | POA: Insufficient documentation

## 2014-02-10 DIAGNOSIS — E119 Type 2 diabetes mellitus without complications: Secondary | ICD-10-CM | POA: Insufficient documentation

## 2014-02-10 DIAGNOSIS — I10 Essential (primary) hypertension: Secondary | ICD-10-CM | POA: Insufficient documentation

## 2014-02-10 DIAGNOSIS — J45901 Unspecified asthma with (acute) exacerbation: Secondary | ICD-10-CM | POA: Insufficient documentation

## 2014-02-10 DIAGNOSIS — B9789 Other viral agents as the cause of diseases classified elsewhere: Secondary | ICD-10-CM

## 2014-02-10 DIAGNOSIS — J069 Acute upper respiratory infection, unspecified: Secondary | ICD-10-CM

## 2014-02-10 DIAGNOSIS — Z8719 Personal history of other diseases of the digestive system: Secondary | ICD-10-CM | POA: Insufficient documentation

## 2014-02-10 NOTE — ED Notes (Signed)
Pt had recent sickness with allergies that has not gotten better and she is now experiencing SOB, productive cough with yellow sputum. Pt report taking medications for symptoms with no change in symptoms. Pt alert and ambulatory to room.

## 2014-02-11 MED ORDER — ALBUTEROL SULFATE (2.5 MG/3ML) 0.083% IN NEBU
5.0000 mg | INHALATION_SOLUTION | Freq: Once | RESPIRATORY_TRACT | Status: AC
  Administered 2014-02-11: 5 mg via RESPIRATORY_TRACT
  Filled 2014-02-11: qty 6

## 2014-02-11 MED ORDER — PROMETHAZINE-DM 6.25-15 MG/5ML PO SYRP: 5.0000 mL | ORAL_SOLUTION | Freq: Four times a day (QID) | ORAL | Status: DC | PRN

## 2014-02-11 MED ORDER — GUAIFENESIN ER 1200 MG PO TB12: 1.0000 | ORAL_TABLET | Freq: Two times a day (BID) | ORAL | Status: DC

## 2014-02-11 NOTE — Discharge Instructions (Signed)
Return here as needed.  Followup with primary care Dr. for urgent care increase your fluid intake your chest x-ray did not show any signs of pneumonia

## 2014-02-11 NOTE — ED Provider Notes (Signed)
CSN: [id number]     Arrival date & time 02/10/14  2315 History   First MD Initiated Contact with Patient 02/10/14 2348     Chief Complaint  Patient presents with  . Cough  . Shortness of Breath     (Consider location/radiation/quality/duration/timing/severity/associated sxs/prior Treatment) HPI Patient presents emergency department with cough that is productive over the last 4 days.  Patient, states, that she's had recent allergy symptoms, which is used to having this time of year.  The patient, states, that she normally has worsening of her asthma during this time of the year.  The patient, states, that she's not had chest pain, nausea, vomiting, abdominal pain, back pain, headache, blurred vision, weakness, dizziness, rash, fever, dysuria, or syncope.  The patient, states, that she tried over-the-counter allergy medicines without relief of her symptoms.  She should states, that she has not been taking her inhalers consistently.  Patient, states symptoms have been persistent Past Medical History  Diagnosis Date  . Diabetes mellitus   . Hypertension   . GERD (gastroesophageal reflux disease)   . Fatty liver    History reviewed. No pertinent past surgical history. History reviewed. No pertinent family history. History  Substance Use Topics  . Smoking status: Never Smoker   . Smokeless tobacco: Not on file  . Alcohol Use: No   OB History   Grav Para Term Preterm Abortions TAB SAB Ect Mult Living                 Review of Systems  All other systems negative except as documented in the HPI. All pertinent positives and negatives as reviewed in the HPI.  Allergies  Review of patient's allergies indicates no known allergies.  Home Medications   Prior to Admission medications   Medication Sig Start Date End Date Taking? Authorizing Provider  albuterol (PROVENTIL HFA;VENTOLIN HFA) 108 (90 BASE) MCG/ACT inhaler Inhale 2 puffs into the lungs every 6 (six) hours as needed for wheezing  or shortness of breath.   Yes Historical Provider, MD  beclomethasone (QVAR) 80 MCG/ACT inhaler Inhale 2 puffs into the lungs 2 (two) times daily.   Yes Historical Provider, MD  LOSARTAN POTASSIUM PO Take 1 tablet by mouth daily.   Yes Historical Provider, MD  metFORMIN (GLUCOPHAGE) 1000 MG tablet Take 1,000 mg by mouth 2 (two) times daily with a meal.   Yes Historical Provider, MD   BP 176/96  Pulse 87  Temp(Src) 98.2 F (36.8 C) (Oral)  Resp 20  Ht 5\' 6"  (1.676 m)  Wt 214 lb (97.07 kg)  BMI 34.56 kg/m2  SpO2 100%  LMP 02/09/2014 Physical Exam  Nursing note and vitals reviewed. Constitutional: She is oriented to person, place, and time. She appears well-developed and well-nourished. No distress.  HENT:  Head: Normocephalic and atraumatic.  Mouth/Throat: Oropharynx is clear and moist.  Eyes: Pupils are equal, round, and reactive to light.  Neck: Normal range of motion. Neck supple.  Cardiovascular: Normal rate, regular rhythm and normal heart sounds.  Exam reveals no gallop and no friction rub.   No murmur heard. Pulmonary/Chest: Effort normal and breath sounds normal. No respiratory distress. She has no wheezes. She has no rales.  Neurological: She is alert and oriented to person, place, and time. She exhibits normal muscle tone. Coordination normal.  Skin: Skin is warm and dry. No rash noted. No erythema.    ED Course  Procedures (including critical care time) Labs Review Labs Reviewed - No data to display  Imaging Review Dg Chest 2 View  02/11/2014   CLINICAL DATA:  Shortness of breath  EXAM: CHEST  2 VIEW  COMPARISON:  DG CHEST 2 VIEW dated 05/10/2013  FINDINGS: The lungs are reasonably well inflated. There is no focal infiltrate. The interstitial markings are mildly prominent prominent but stable. The cardiopericardial silhouette is normal in size. There is no pleural effusion or pneumothorax. The observed portions of the bony thorax exhibit no acute abnormality.  IMPRESSION:  There is no focal pneumonia nor evidence of CHF. The interstitial markings are mildly prominent but are not greatly changed from the previous study. One cannot exclude acute bronchitis in the appropriate clinical setting.   Electronically Signed   By: David  Martinique   On: 02/11/2014 01:02     Patient retreated for URI/seasonal allergies.  Patient is advised to start using her inhalers as prescribed.  Patient be given symptomatic treatment.  She is told to followup with her primary care Dr. told to return here for any worsening in her symptoms.  Patient does not have any other significant abnormalities noted on chest x-ray other than bronchitis type finding.  Patient is given the results, and plan and she voices an understanding.  All questions were answered  Brent General, PA-C 02/18/14 [phone]

## 2014-02-20 NOTE — ED Provider Notes (Signed)
Medical screening examination/treatment/procedure(s) were performed by non-physician practitioner and as supervising physician I was immediately available for consultation/collaboration.   EKG Interpretation None       Sorcha Rotunno, MD 02/20/14 2119 

## 2014-03-26 ENCOUNTER — Emergency Department (HOSPITAL_COMMUNITY)
Admission: EM | Admit: 2014-03-26 | Discharge: 2014-03-26 | Disposition: A | Discharge status: Home / Self Care | Payer: Medicaid Other | Attending: Emergency Medicine | Admitting: Emergency Medicine

## 2014-03-26 ENCOUNTER — Encounter (HOSPITAL_COMMUNITY): Payer: Self-pay | Admitting: Emergency Medicine

## 2014-03-26 DIAGNOSIS — M25519 Pain in unspecified shoulder: Secondary | ICD-10-CM

## 2014-03-26 DIAGNOSIS — Z8719 Personal history of other diseases of the digestive system: Secondary | ICD-10-CM | POA: Insufficient documentation

## 2014-03-26 DIAGNOSIS — Z79899 Other long term (current) drug therapy: Secondary | ICD-10-CM | POA: Insufficient documentation

## 2014-03-26 DIAGNOSIS — E119 Type 2 diabetes mellitus without complications: Secondary | ICD-10-CM | POA: Insufficient documentation

## 2014-03-26 DIAGNOSIS — IMO0002 Reserved for concepts with insufficient information to code with codable children: Secondary | ICD-10-CM | POA: Insufficient documentation

## 2014-03-26 DIAGNOSIS — M62838 Other muscle spasm: Secondary | ICD-10-CM

## 2014-03-26 DIAGNOSIS — I10 Essential (primary) hypertension: Secondary | ICD-10-CM | POA: Insufficient documentation

## 2014-03-26 MED ORDER — TRAMADOL-ACETAMINOPHEN 37.5-325 MG PO TABS
1.0000 | ORAL_TABLET | Freq: Four times a day (QID) | ORAL | Status: DC | PRN
Start: 2014-03-26 — End: 2017-03-16

## 2014-03-26 MED ORDER — METHOCARBAMOL 500 MG PO TABS: 500.0000 mg | ORAL_TABLET | Freq: Two times a day (BID) | ORAL | Status: DC | PRN

## 2014-03-26 NOTE — ED Notes (Signed)
Pt states that she has been having stiffness in her neck and shoulders x 2 months.  States it started in the back of her neck but it is coming around to the front.  States that she has a goiter and and doesn't know if that is what is causing the pain.  Also c/o sore throat.

## 2014-03-26 NOTE — ED Provider Notes (Signed)
CSN: [id number]     Arrival date & time 03/26/14  1605 History   First MD Initiated Contact with Patient 03/26/14 1625     Chief Complaint  Patient presents with  . Neck Pain     (Consider location/radiation/quality/duration/timing/severity/associated sxs/prior Treatment) The history is provided by the patient and medical records.   This is a 43 year old female with past medical history significant for hypertension and diabetes, presenting to the ED for posterior neck and shoulder pain for the past 2 months. States it started on the back of both her shoulders and has spread around to her anterior shoulders. She describes her pain as a "soreness and stiffness". She has had no difficulty turning her neck. She denies associated headache, fever, or chills.  No numbness, paresthesias, or weakness of upper extremities.  Pt also states she did have a sore throat a few days ago which was due to her allergies.  She was having some pain with swallowing, however this has since resolved.  No difficulty swallowing.  No chest pain or SOB.  Pt mentions she does have hx of goiter and underwent bx several years ago with negative results. She has not had any FU ultrasounds since that time.  Past Medical History  Diagnosis Date  . Diabetes mellitus   . Hypertension   . GERD (gastroesophageal reflux disease)   . Fatty liver    No past surgical history on file. No family history on file. History  Substance Use Topics  . Smoking status: Never Smoker   . Smokeless tobacco: Not on file  . Alcohol Use: No   OB History   Grav Para Term Preterm Abortions TAB SAB Ect Mult Living                 Review of Systems  Musculoskeletal: Positive for myalgias and neck pain.  All other systems reviewed and are negative.     Allergies  Review of patient's allergies indicates no known allergies.  Home Medications   Prior to Admission medications   Medication Sig Start Date End Date Taking? Authorizing  Provider  albuterol (PROVENTIL HFA;VENTOLIN HFA) 108 (90 BASE) MCG/ACT inhaler Inhale 2 puffs into the lungs every 6 (six) hours as needed for wheezing or shortness of breath.   Yes Historical Provider, MD  beclomethasone (QVAR) 80 MCG/ACT inhaler Inhale 2 puffs into the lungs 2 (two) times daily.   Yes Historical Provider, MD  LOSARTAN POTASSIUM PO Take 50 mg by mouth daily.    Yes Historical Provider, MD  metFORMIN (GLUCOPHAGE) 1000 MG tablet Take 1,000 mg by mouth 2 (two) times daily with a meal.   Yes Historical Provider, MD   BP 155/85  Pulse 89  Temp(Src) 98.8 F (37.1 C) (Oral)  Resp 18  SpO2 97%  Physical Exam  Nursing note and vitals reviewed. Constitutional: She is oriented to person, place, and time. She appears well-developed and well-nourished. No distress.  HENT:  Head: Normocephalic and atraumatic.  Mouth/Throat: Uvula is midline, oropharynx is clear and moist and mucous membranes are normal. No oropharyngeal exudate or posterior oropharyngeal edema.  Oropharynx WNL, airway patent  Eyes: Conjunctivae and EOM are normal. Pupils are equal, round, and reactive to light.  Neck: Trachea normal, normal range of motion, full passive range of motion without pain and phonation normal. Neck supple. Muscular tenderness present. No rigidity. Thyromegaly present.  No meningeal signs Muscle tenderness and spasm noted along trapezius bilaterally Thyroid does feel mildly enlarged, non-tender, no palpable masses, normal  phonation, trachea midline  Cardiovascular: Normal rate, regular rhythm and normal heart sounds.   Pulmonary/Chest: Effort normal and breath sounds normal. No respiratory distress. She has no wheezes.  Musculoskeletal: Normal range of motion.  Full ROM and strength of BUE; strong radial pulses bilaterally; normal sensation diffusely throughout arms  Neurological: She is alert and oriented to person, place, and time.  Skin: Skin is warm and dry. She is not diaphoretic.   Psychiatric: She has a normal mood and affect.    ED Course  Procedures (including critical care time) Labs Review Labs Reviewed - No data to display  Imaging Review No results found.   EKG Interpretation None      MDM   Final diagnoses:  Muscle spasms of neck  Shoulder pain   Neck/shoulder pain:  On exam, pt has muscle tenderness and spasms diffusely across her trapezius muscle.  Full ROM of neck without rigidity, no headache or fever to suggest meningitis.  She has full ROM and strength of BUE.  Both arms remain NVI.  She has no red flag sx to suggest central cord syndrome or other neurovascular compromise.  No chest pain or SOB.  Do not feel sx represent atypical ACS, PE, dissection, or other acute cardiac event.  Sore throat/goiter:  Her HEENT exam is WNL, no airway compromise.  Her thyroid does feel mildly enlarged without palpable masses.  Thyroid is non-tender, trachea midline, normal phonation.  Given hx of goiter and thyroid bx, will arrange for OP thyroid ultrasound for further evaluation.    She will FU with PCP (Avbuere) after u/s to discuss results.  ultracet and robaxin for pain.  Discussed plan with patient, he/she acknowledged understanding and agreed with plan of care.  Return precautions given for new or worsening symptoms.  Larene Pickett, PA-C 03/26/14 1819

## 2014-03-26 NOTE — ED Provider Notes (Signed)
Medical screening examination/treatment/procedure(s) were performed by non-physician practitioner and as supervising physician I was immediately available for consultation/collaboration.   EKG Interpretation None       Threasa Beards, MD 03/26/14 [phone]

## 2014-03-26 NOTE — Discharge Instructions (Signed)
Take the prescribed medication as directed. Outpatient thyroid ultrasound has been ordered for you.  Please make sure to follow-up with your primary care physician after you have this done to discuss results. Return to the ED for new or worsening symptoms.

## 2014-05-06 ENCOUNTER — Ambulatory Visit: Payer: Medicaid Other | Admitting: Podiatry

## 2014-05-16 ENCOUNTER — Encounter (HOSPITAL_COMMUNITY): Payer: Self-pay | Admitting: Emergency Medicine

## 2014-05-16 ENCOUNTER — Emergency Department (HOSPITAL_COMMUNITY): Payer: Medicaid Other

## 2014-05-16 ENCOUNTER — Emergency Department (HOSPITAL_COMMUNITY)
Admission: EM | Admit: 2014-05-16 | Discharge: 2014-05-16 | Disposition: A | Discharge status: Home / Self Care | Payer: Medicaid Other | Attending: Emergency Medicine | Admitting: Emergency Medicine

## 2014-05-16 DIAGNOSIS — R0602 Shortness of breath: Secondary | ICD-10-CM | POA: Diagnosis not present

## 2014-05-16 DIAGNOSIS — E119 Type 2 diabetes mellitus without complications: Secondary | ICD-10-CM | POA: Insufficient documentation

## 2014-05-16 DIAGNOSIS — Z79899 Other long term (current) drug therapy: Secondary | ICD-10-CM | POA: Insufficient documentation

## 2014-05-16 DIAGNOSIS — Z7982 Long term (current) use of aspirin: Secondary | ICD-10-CM | POA: Diagnosis not present

## 2014-05-16 DIAGNOSIS — R079 Chest pain, unspecified: Secondary | ICD-10-CM | POA: Insufficient documentation

## 2014-05-16 DIAGNOSIS — I10 Essential (primary) hypertension: Secondary | ICD-10-CM | POA: Insufficient documentation

## 2014-05-16 DIAGNOSIS — IMO0002 Reserved for concepts with insufficient information to code with codable children: Secondary | ICD-10-CM | POA: Diagnosis not present

## 2014-05-16 DIAGNOSIS — Z8719 Personal history of other diseases of the digestive system: Secondary | ICD-10-CM | POA: Insufficient documentation

## 2014-05-16 LAB — I-STAT TROPONIN, ED: TROPONIN I, POC: 0 ng/mL (ref 0.00–0.08)

## 2014-05-16 LAB — CBC
HCT: 36.1 % (ref 36.0–46.0)
HEMOGLOBIN: 11.6 g/dL — AB (ref 12.0–15.0)
MCH: 22.6 pg — ABNORMAL LOW (ref 26.0–34.0)
MCHC: 32.1 g/dL (ref 30.0–36.0)
MCV: 70.4 fL — ABNORMAL LOW (ref 78.0–100.0)
Platelets: 222 10*3/uL (ref 150–400)
RBC: 5.13 MIL/uL — ABNORMAL HIGH (ref 3.87–5.11)
RDW: 17 % — AB (ref 11.5–15.5)
WBC: 6.6 10*3/uL (ref 4.0–10.5)

## 2014-05-16 LAB — BASIC METABOLIC PANEL
Anion gap: 16 — ABNORMAL HIGH (ref 5–15)
BUN: 10 mg/dL (ref 6–23)
CHLORIDE: 100 meq/L (ref 96–112)
CO2: 20 mEq/L (ref 19–32)
Calcium: 9.5 mg/dL (ref 8.4–10.5)
Creatinine, Ser: 0.77 mg/dL (ref 0.50–1.10)
GLUCOSE: 243 mg/dL — AB (ref 70–99)
POTASSIUM: 4.1 meq/L (ref 3.7–5.3)
SODIUM: 136 meq/L — AB (ref 137–147)

## 2014-05-16 LAB — TROPONIN I

## 2014-05-16 NOTE — ED Notes (Signed)
Pt ambulatory and laughing and talking with family upon d/c. Pt verbalizes the need to follow up.

## 2014-05-16 NOTE — ED Provider Notes (Signed)
CSN: [id number]     Arrival date & time 05/16/14  1332 History   First MD Initiated Contact with Patient 05/16/14 1459     Chief Complaint  Patient presents with  . Chest Pain     (Consider location/radiation/quality/duration/timing/severity/associated sxs/prior Treatment) Patient is a 43 y.o. female presenting with chest pain.  Chest Pain Pain location:  Substernal area Pain quality: pressure   Pain radiates to:  Does not radiate Pain severity:  Moderate Onset quality:  Sudden Duration:  1 hour Timing:  Constant Progression:  Resolved Chronicity:  New Context comment:  Outside at choir practice.  Had recently eaten baked beans prior to onset of symptoms. Relieved by:  Aspirin Worsened by:  Nothing tried Associated symptoms: shortness of breath (very brief "it took my breath away" momentarily)   Associated symptoms: no abdominal pain, no diaphoresis, no dizziness, no nausea and not vomiting     Past Medical History  Diagnosis Date  . Diabetes mellitus   . Hypertension   . GERD (gastroesophageal reflux disease)   . Fatty liver    History reviewed. No pertinent past surgical history. No family history on file. History  Substance Use Topics  . Smoking status: Never Smoker   . Smokeless tobacco: Not on file  . Alcohol Use: No   OB History   Grav Para Term Preterm Abortions TAB SAB Ect Mult Living                 Review of Systems  Constitutional: Negative for diaphoresis.  Respiratory: Positive for shortness of breath (very brief "it took my breath away" momentarily).   Cardiovascular: Positive for chest pain.  Gastrointestinal: Negative for nausea, vomiting and abdominal pain.  Neurological: Negative for dizziness.  All other systems reviewed and are negative.     Allergies  Review of patient's allergies indicates no known allergies.  Home Medications   Prior to Admission medications   Medication Sig Start Date End Date Taking? Authorizing Provider   albuterol (PROVENTIL HFA;VENTOLIN HFA) 108 (90 BASE) MCG/ACT inhaler Inhale 2 puffs into the lungs every 6 (six) hours as needed for wheezing or shortness of breath.   Yes Historical Provider, MD  aspirin 325 MG tablet Take 650 mg by mouth daily.   Yes Historical Provider, MD  beclomethasone (QVAR) 80 MCG/ACT inhaler Inhale 2 puffs into the lungs 2 (two) times daily.   Yes Historical Provider, MD  losartan (COZAAR) 50 MG tablet Take 50 mg by mouth daily.   Yes Historical Provider, MD  metFORMIN (GLUCOPHAGE) 1000 MG tablet Take 1,000 mg by mouth 2 (two) times daily with a meal.   Yes Historical Provider, MD  methocarbamol (ROBAXIN) 500 MG tablet Take 1 tablet (500 mg total) by mouth 2 (two) times daily as needed. 03/26/14  Yes Larene Pickett, PA-C  traMADol-acetaminophen (ULTRACET) 37.5-325 MG per tablet Take 1 tablet by mouth every 6 (six) hours as needed. 03/26/14  Yes Larene Pickett, PA-C   BP 133/64  Pulse 75  Temp(Src) 98 F (36.7 C) (Oral)  Resp 19  SpO2 98% Physical Exam  Nursing note and vitals reviewed. Constitutional: She is oriented to person, place, and time. She appears well-developed and well-nourished. No distress.  HENT:  Head: Normocephalic and atraumatic.  Mouth/Throat: Oropharynx is clear and moist.  Eyes: Conjunctivae are normal. Pupils are equal, round, and reactive to light. No scleral icterus.  Neck: Neck supple.  Cardiovascular: Normal rate, regular rhythm, normal heart sounds and intact distal pulses.  No murmur heard. Pulmonary/Chest: Effort normal and breath sounds normal. No stridor. No respiratory distress. She has no rales.  Abdominal: Soft. Bowel sounds are normal. She exhibits no distension. There is no tenderness.  Musculoskeletal: Normal range of motion. She exhibits no edema.  Neurological: She is alert and oriented to person, place, and time.  Skin: Skin is warm and dry. No rash noted.  Psychiatric: She has a normal mood and affect. Her behavior is  normal.    ED Course  Procedures (including critical care time) Labs Review Labs Reviewed  CBC - Abnormal; Notable for the following:    RBC 5.13 (*)    Hemoglobin 11.6 (*)    MCV 70.4 (*)    MCH 22.6 (*)    RDW 17.0 (*)    All other components within normal limits  BASIC METABOLIC PANEL - Abnormal; Notable for the following:    Sodium 136 (*)    Glucose, Bld 243 (*)    Anion gap 16 (*)    All other components within normal limits  I-STAT TROPOININ, ED    Imaging Review Dg Chest 2 View  05/16/2014   CLINICAL DATA:  Shortness of breath and chest pain for 2 hr  EXAM: CHEST  2 VIEW  COMPARISON:  02/11/2014  FINDINGS: The heart size and mediastinal contours are within normal limits. Both lungs are clear. The visualized skeletal structures are unremarkable. Left glenohumeral joint degenerative change reidentified.  IMPRESSION: No active cardiopulmonary disease.   Electronically Signed   By: Conchita Paris M.D.   On: 05/16/2014 16:01  All radiology studies independently viewed by me.      EKG Interpretation   Date/Time:  Saturday May 16 2014 13:59:13 EDT Ventricular Rate:  82 PR Interval:  176 QRS Duration: 83 QT Interval:  388 QTC Calculation: 453 R Axis:   77 Text Interpretation:  Sinus rhythm nonspecific ST/T changes, similar to  prior.  Confirmed by [facility]  MD, TREY ([id number]) on 05/16/2014 3:26:25 PM      MDM   Final diagnoses:  Chest pain, unspecified chest pain type    Atypical chest pain.  Most likely GI related, but she does have some risk factors (HTN, DM, HLD, family hx) so will check delta troponin.  Unlikely PE and she is PERC negative.  History inconsistent with Dissection.    6:06 PM delta trop negative.  Remains well appearing.  Stable for dc with close outpt follow up.   Houston Siren III, MD 05/16/14 [phone]

## 2014-05-16 NOTE — ED Notes (Signed)
Pt c/o of chest pain that started today. Hx of hypertension. Took 2 aspirin before she came. Pain 1/10 currently. Denies n/v/d

## 2014-05-16 NOTE — Discharge Instructions (Signed)

## 2014-05-16 NOTE — ED Notes (Signed)
Pt denies pain. Pt laughing and talking to visitors at bedside.

## 2014-07-21 ENCOUNTER — Other Ambulatory Visit (HOSPITAL_COMMUNITY): Payer: Self-pay | Admitting: Internal Medicine

## 2014-07-21 DIAGNOSIS — Z1231 Encounter for screening mammogram for malignant neoplasm of breast: Secondary | ICD-10-CM

## 2014-07-31 ENCOUNTER — Ambulatory Visit (HOSPITAL_COMMUNITY)
Admission: RE | Admit: 2014-07-31 | Discharge: 2014-07-31 | Disposition: A | Discharge status: Home / Self Care | Payer: Medicaid Other | Source: Ambulatory Visit | Attending: Internal Medicine | Admitting: Internal Medicine

## 2014-07-31 DIAGNOSIS — Z1231 Encounter for screening mammogram for malignant neoplasm of breast: Secondary | ICD-10-CM

## 2014-09-21 ENCOUNTER — Other Ambulatory Visit (INDEPENDENT_AMBULATORY_CARE_PROVIDER_SITE_OTHER): Payer: Self-pay | Admitting: Surgery

## 2014-09-21 DIAGNOSIS — E041 Nontoxic single thyroid nodule: Secondary | ICD-10-CM

## 2014-09-22 ENCOUNTER — Inpatient Hospital Stay
Admission: RE | Admit: 2014-09-22 | Discharge: 2014-09-22 | Disposition: A | Discharge status: Home / Self Care | Payer: Self-pay | Source: Ambulatory Visit | Attending: Interventional Radiology | Admitting: Interventional Radiology

## 2014-09-22 ENCOUNTER — Other Ambulatory Visit (HOSPITAL_COMMUNITY): Payer: Self-pay | Admitting: Interventional Radiology

## 2014-09-22 DIAGNOSIS — E041 Nontoxic single thyroid nodule: Secondary | ICD-10-CM

## 2014-09-29 ENCOUNTER — Other Ambulatory Visit (HOSPITAL_COMMUNITY)
Admission: RE | Admit: 2014-09-29 | Discharge: 2014-09-29 | Disposition: A | Discharge status: Home / Self Care | Payer: Medicaid Other | Source: Ambulatory Visit | Attending: Interventional Radiology | Admitting: Interventional Radiology

## 2014-09-29 ENCOUNTER — Ambulatory Visit
Admission: RE | Admit: 2014-09-29 | Discharge: 2014-09-29 | Disposition: A | Discharge status: Home / Self Care | Payer: Medicaid Other | Source: Ambulatory Visit | Attending: Surgery | Admitting: Surgery

## 2014-09-29 DIAGNOSIS — E041 Nontoxic single thyroid nodule: Secondary | ICD-10-CM | POA: Diagnosis not present

## 2014-10-12 ENCOUNTER — Telehealth (INDEPENDENT_AMBULATORY_CARE_PROVIDER_SITE_OTHER): Payer: Self-pay

## 2014-10-12 NOTE — Telephone Encounter (Signed)
Patient calling into office for pathology results.  Patient advised her pathology was Benign.  Patient would like a copy of her report.  Advised patient we will be glad to send a copy once Dr. Brantley Stage has reviewed.  Patient verbalized understanding.

## 2014-11-02 ENCOUNTER — Other Ambulatory Visit (HOSPITAL_COMMUNITY): Payer: Self-pay | Admitting: Internal Medicine

## 2014-11-02 DIAGNOSIS — Z1231 Encounter for screening mammogram for malignant neoplasm of breast: Secondary | ICD-10-CM

## 2014-11-03 ENCOUNTER — Other Ambulatory Visit: Payer: Self-pay | Admitting: Rehabilitation

## 2014-11-03 DIAGNOSIS — M47812 Spondylosis without myelopathy or radiculopathy, cervical region: Secondary | ICD-10-CM

## 2014-11-11 ENCOUNTER — Ambulatory Visit (HOSPITAL_COMMUNITY)
Admission: RE | Admit: 2014-11-11 | Discharge: 2014-11-11 | Disposition: A | Discharge status: Home / Self Care | Payer: Medicaid Other | Source: Ambulatory Visit | Attending: Internal Medicine | Admitting: Internal Medicine

## 2014-11-11 DIAGNOSIS — Z1231 Encounter for screening mammogram for malignant neoplasm of breast: Secondary | ICD-10-CM | POA: Insufficient documentation

## 2014-11-13 ENCOUNTER — Other Ambulatory Visit: Payer: Medicaid Other

## 2014-11-23 ENCOUNTER — Ambulatory Visit
Admission: RE | Admit: 2014-11-23 | Discharge: 2014-11-23 | Disposition: A | Discharge status: Home / Self Care | Payer: Medicaid Other | Source: Ambulatory Visit | Attending: Rehabilitation | Admitting: Rehabilitation

## 2014-11-23 DIAGNOSIS — M47812 Spondylosis without myelopathy or radiculopathy, cervical region: Secondary | ICD-10-CM

## 2015-05-09 ENCOUNTER — Encounter (HOSPITAL_COMMUNITY): Payer: Self-pay

## 2015-05-09 ENCOUNTER — Emergency Department (HOSPITAL_COMMUNITY)
Admission: EM | Admit: 2015-05-09 | Discharge: 2015-05-09 | Disposition: A | Discharge status: Home / Self Care | Payer: Medicaid Other | Attending: Emergency Medicine | Admitting: Emergency Medicine

## 2015-05-09 DIAGNOSIS — Z7982 Long term (current) use of aspirin: Secondary | ICD-10-CM | POA: Insufficient documentation

## 2015-05-09 DIAGNOSIS — R110 Nausea: Secondary | ICD-10-CM | POA: Diagnosis not present

## 2015-05-09 DIAGNOSIS — E119 Type 2 diabetes mellitus without complications: Secondary | ICD-10-CM | POA: Diagnosis not present

## 2015-05-09 DIAGNOSIS — R109 Unspecified abdominal pain: Secondary | ICD-10-CM | POA: Diagnosis not present

## 2015-05-09 DIAGNOSIS — I10 Essential (primary) hypertension: Secondary | ICD-10-CM | POA: Diagnosis not present

## 2015-05-09 DIAGNOSIS — K219 Gastro-esophageal reflux disease without esophagitis: Secondary | ICD-10-CM | POA: Insufficient documentation

## 2015-05-09 DIAGNOSIS — R197 Diarrhea, unspecified: Secondary | ICD-10-CM | POA: Diagnosis not present

## 2015-05-09 DIAGNOSIS — Z79899 Other long term (current) drug therapy: Secondary | ICD-10-CM | POA: Diagnosis not present

## 2015-05-09 DIAGNOSIS — Z7951 Long term (current) use of inhaled steroids: Secondary | ICD-10-CM | POA: Diagnosis not present

## 2015-05-09 MED ORDER — PROMETHAZINE HCL 25 MG PO TABS: 25.0000 mg | ORAL_TABLET | Freq: Four times a day (QID) | ORAL | Status: DC | PRN

## 2015-05-09 MED ORDER — PROMETHAZINE HCL 25 MG RE SUPP: 25.0000 mg | Freq: Four times a day (QID) | RECTAL | Status: DC | PRN

## 2015-05-09 NOTE — ED Notes (Signed)
Pt presents with c/o abdominal pain and diarrhea after eating some undercooked chicken from [employer] earlier today. Pt reports one episode of diarrhea so far.

## 2015-05-09 NOTE — ED Provider Notes (Signed)
CSN: [id number]     Arrival date & time 05/09/15  1942 History   First MD Initiated Contact with Patient 05/09/15 2010     Chief Complaint  Patient presents with  . Abdominal Pain  . Diarrhea     (Consider location/radiation/quality/duration/timing/severity/associated sxs/prior Treatment) Patient is a 44 y.o. female presenting with diarrhea.  Diarrhea Quality:  Mucous and watery Onset quality:  Gradual Number of episodes:  3 Duration:  2 hours Timing:  Intermittent Progression:  Unchanged Relieved by:  None tried Worsened by:  Nothing tried Ineffective treatments:  None tried Associated symptoms: abdominal pain   Associated symptoms: no fever, no headaches, no URI and no vomiting     Past Medical History  Diagnosis Date  . Diabetes mellitus   . Hypertension   . GERD (gastroesophageal reflux disease)   . Fatty liver    History reviewed. No pertinent past surgical history. No family history on file. History  Substance Use Topics  . Smoking status: Never Smoker   . Smokeless tobacco: Not on file  . Alcohol Use: No   OB History    No data available     Review of Systems  Constitutional: Negative for fever.  HENT: Negative for drooling and ear pain.   Respiratory: Negative for cough, chest tightness and shortness of breath.   Cardiovascular: Negative for chest pain.  Gastrointestinal: Positive for nausea, abdominal pain and diarrhea. Negative for vomiting.  Neurological: Negative for headaches.  All other systems reviewed and are negative.     Allergies  Review of patient's allergies indicates no known allergies.  Home Medications   Prior to Admission medications   Medication Sig Start Date End Date Taking? Authorizing Provider  albuterol (PROVENTIL HFA;VENTOLIN HFA) 108 (90 BASE) MCG/ACT inhaler Inhale 2 puffs into the lungs every 6 (six) hours as needed for wheezing or shortness of breath.   Yes Historical Provider, MD  aspirin 325 MG tablet Take 325-650  mg by mouth daily as needed for headache (high blood pressure).    Yes Historical Provider, MD  beclomethasone (QVAR) 80 MCG/ACT inhaler Inhale 2 puffs into the lungs 2 (two) times daily as needed (asthma).    Yes Historical Provider, MD  gabapentin (NEURONTIN) 100 MG capsule Take 1 capsule by mouth 3 (three) times daily as needed. pain 03/23/15  Yes Historical Provider, MD  losartan (COZAAR) 50 MG tablet Take 50 mg by mouth daily.   Yes Historical Provider, MD  metFORMIN (GLUCOPHAGE) 1000 MG tablet Take 1,000 mg by mouth daily with breakfast.    Yes Historical Provider, MD  methocarbamol (ROBAXIN) 500 MG tablet Take 1 tablet (500 mg total) by mouth 2 (two) times daily as needed. Patient taking differently: Take 500 mg by mouth 2 (two) times daily as needed for muscle spasms.  03/26/14  Yes Larene Pickett, PA-C  tiZANidine (ZANAFLEX) 2 MG tablet Take 1-2 tablets by mouth every 8 (eight) hours as needed. Muscle spasms 04/15/15  Yes Historical Provider, MD  traMADol-acetaminophen (ULTRACET) 37.5-325 MG per tablet Take 1 tablet by mouth every 6 (six) hours as needed. Patient taking differently: Take 1 tablet by mouth every 6 (six) hours as needed for moderate pain.  03/26/14  Yes Larene Pickett, PA-C  Vitamin D, Ergocalciferol, (DRISDOL) 50000 UNITS CAPS capsule Take 1 capsule by mouth once a week. tuesdays 03/26/15  Yes Historical Provider, MD  gemfibrozil (LOPID) 600 MG tablet Take 600 mg by mouth 2 (two) times daily. 03/23/15   Historical Provider, MD  promethazine (PHENERGAN) 25 MG suppository Place 1 suppository (25 mg total) rectally every 6 (six) hours as needed for nausea or vomiting. 05/09/15   Merrily Pew, MD  promethazine (PHENERGAN) 25 MG tablet Take 1 tablet (25 mg total) by mouth every 6 (six) hours as needed for nausea or vomiting. 05/09/15   Merrily Pew, MD   BP 161/86 mmHg  Pulse 96  Temp(Src) 98.1 F (36.7 C) (Oral)  Resp 22  SpO2 99%  LMP 04/25/2015 (Approximate) Physical Exam   Constitutional: She is oriented to person, place, and time. She appears well-developed and well-nourished.  HENT:  Head: Normocephalic and atraumatic.  Cardiovascular: Normal rate.   Abdominal: Soft. She exhibits no distension. There is no tenderness. There is no guarding.  Musculoskeletal: Normal range of motion.  Neurological: She is alert and oriented to person, place, and time.  Nursing note and vitals reviewed.   ED Course  Procedures (including critical care time) Labs Review Labs Reviewed - No data to display  Imaging Review No results found.   EKG Interpretation None      MDM   Final diagnoses:  Diarrhea   Ate rotisserie chicken from [employer] and approx 1 hour afterwards had multiple episodes of nonbloody, mucousy watery diarrhea. Also with nausea. No fevers. Gas and 'gurgling' in her stomach but no focal pain. 2 other family members with same also here to be treated. Exam as above, no evidence of acute abdomen.  S/s c/w early gastroenteritis, most likely viral. Doubt bacterial at this time. Doubt serious intraabdominal pathology at this time with normal exam, normal vitals and temporal relationship with foul smelling/tasting chicken. Will dc with supportive care and strict return precautions.   I have personally and contemperaneously reviewed labs and imaging and used in my decision making as above.   A medical screening exam was performed and I feel the patient has had an appropriate workup for their chief complaint at this time and likelihood of emergent condition existing is low. They have been counseled on decision, discharge, follow up and which symptoms necessitate immediate return to the emergency department. They or their family verbally stated understanding and agreement with plan and discharged in stable condition.   Merrily Pew, MD 05/09/15 2158

## 2015-12-14 ENCOUNTER — Other Ambulatory Visit: Payer: Self-pay

## 2015-12-14 DIAGNOSIS — Z1231 Encounter for screening mammogram for malignant neoplasm of breast: Secondary | ICD-10-CM

## 2015-12-31 ENCOUNTER — Ambulatory Visit: Payer: Medicaid Other

## 2016-01-14 ENCOUNTER — Ambulatory Visit: Payer: Medicaid Other

## 2016-05-10 ENCOUNTER — Encounter (HOSPITAL_COMMUNITY): Payer: Self-pay | Admitting: Emergency Medicine

## 2016-05-10 ENCOUNTER — Emergency Department (HOSPITAL_COMMUNITY)
Admission: EM | Admit: 2016-05-10 | Discharge: 2016-05-11 | Disposition: A | Discharge status: Home / Self Care | Payer: Medicaid Other | Attending: Emergency Medicine | Admitting: Emergency Medicine

## 2016-05-10 DIAGNOSIS — Z7984 Long term (current) use of oral hypoglycemic drugs: Secondary | ICD-10-CM | POA: Diagnosis not present

## 2016-05-10 DIAGNOSIS — Z79899 Other long term (current) drug therapy: Secondary | ICD-10-CM | POA: Insufficient documentation

## 2016-05-10 DIAGNOSIS — I10 Essential (primary) hypertension: Secondary | ICD-10-CM | POA: Diagnosis not present

## 2016-05-10 DIAGNOSIS — M545 Low back pain, unspecified: Secondary | ICD-10-CM

## 2016-05-10 DIAGNOSIS — R8299 Other abnormal findings in urine: Secondary | ICD-10-CM | POA: Diagnosis not present

## 2016-05-10 DIAGNOSIS — R82998 Other abnormal findings in urine: Secondary | ICD-10-CM

## 2016-05-10 DIAGNOSIS — Z7982 Long term (current) use of aspirin: Secondary | ICD-10-CM | POA: Insufficient documentation

## 2016-05-10 DIAGNOSIS — E119 Type 2 diabetes mellitus without complications: Secondary | ICD-10-CM | POA: Diagnosis not present

## 2016-05-10 DIAGNOSIS — M549 Dorsalgia, unspecified: Secondary | ICD-10-CM | POA: Diagnosis present

## 2016-05-10 NOTE — ED Triage Notes (Signed)
Pt is c/o back pain at the kidney area on both sides  Pt states she has had discomfort for several days but it is worse today  Pt states she thinks it may be her kidneys because her urine has been "foamy"

## 2016-05-11 LAB — URINALYSIS, ROUTINE W REFLEX MICROSCOPIC
BILIRUBIN URINE: NEGATIVE
Glucose, UA: >1000 mg/dL — AB
Hgb urine dipstick: NEGATIVE
Ketones, ur: 15 mg/dL — AB
Leukocytes, UA: NEGATIVE
NITRITE: NEGATIVE
Protein, ur: NEGATIVE mg/dL
Specific Gravity, Urine: 1.03 (ref 1.005–1.030)
pH: 5 (ref 5.0–8.0)

## 2016-05-11 LAB — POC URINE PREG, ED: PREG TEST UR: NEGATIVE

## 2016-05-11 LAB — URINE MICROSCOPIC-ADD ON

## 2016-05-11 MED ORDER — OXYCODONE-ACETAMINOPHEN 5-325 MG PO TABS
1.0000 | ORAL_TABLET | Freq: Once | ORAL | Status: AC
  Administered 2016-05-11: 1 via ORAL
  Filled 2016-05-11: qty 1

## 2016-05-11 MED ORDER — OXYCODONE-ACETAMINOPHEN 5-325 MG PO TABS: 1.0000 | ORAL_TABLET | ORAL | 0 refills | Status: DC | PRN

## 2016-05-11 MED ORDER — NAPROXEN 500 MG PO TABS: 500.0000 mg | ORAL_TABLET | Freq: Two times a day (BID) | ORAL | 0 refills | Status: DC

## 2016-05-11 MED ORDER — CYCLOBENZAPRINE HCL 10 MG PO TABS
10.0000 mg | ORAL_TABLET | Freq: Once | ORAL | Status: AC
  Administered 2016-05-11: 10 mg via ORAL
  Filled 2016-05-11: qty 1

## 2016-05-11 MED ORDER — ORPHENADRINE CITRATE ER 100 MG PO TB12: 100.0000 mg | ORAL_TABLET | Freq: Two times a day (BID) | ORAL | 0 refills | Status: DC

## 2016-05-11 MED ORDER — IBUPROFEN 800 MG PO TABS
800.0000 mg | ORAL_TABLET | Freq: Once | ORAL | Status: AC
  Administered 2016-05-11: 800 mg via ORAL
  Filled 2016-05-11: qty 1

## 2016-05-11 NOTE — ED Provider Notes (Signed)
[facility] DEPT Provider Note   CSN: [id number] Arrival date & time: 05/10/16  2224  First Provider Contact:  None    By signing my name below, I, Higinio Plan, attest that this documentation has been prepared under the direction and in the presence of Delora Fuel, MD . Electronically Signed: Higinio Plan, Scribe. 05/11/2016. 12:27 AM.  History   Chief Complaint Chief Complaint  Patient presents with  . Back Pain   HPI Comments: Hayley Collins is a 45 y.o. female with PMHx of DM, GERD, and HTN, who presents to the Emergency Department complaining of gradually worsening, 7/10, bilateral flank pain that began several days ago and worsened today. Pt reports she woke up this morning with severe pain in which she could "hardly move." She states her pain is exacerbated with certain movements but does not report any alleviating factors. Pt notes associated urinary frequency and "foamy urine." She states she has not taken any medication to relieve her pain. Pt denies any new activity or recent injury, dysuria, fever, chills, diaphoresis and hematuria. Pt notes her last menstrual period was normal and began on 7/9.   The history is provided by the patient. No language interpreter was used.   Past Medical History:  Diagnosis Date  . Diabetes mellitus   . Fatty liver   . GERD (gastroesophageal reflux disease)   . Hypertension    There are no active problems to display for this patient.  History reviewed. No pertinent surgical history.  OB History    No data available     Home Medications    Prior to Admission medications   Medication Sig Start Date End Date Taking? Authorizing Provider  albuterol (PROVENTIL HFA;VENTOLIN HFA) 108 (90 BASE) MCG/ACT inhaler Inhale 2 puffs into the lungs every 6 (six) hours as needed for wheezing or shortness of breath.   Yes Historical Provider, MD  aspirin 325 MG tablet Take 325-650 mg by mouth daily as needed for headache (high blood pressure).    Yes  Historical Provider, MD  beclomethasone (QVAR) 80 MCG/ACT inhaler Inhale 2 puffs into the lungs 2 (two) times daily as needed (asthma).    Yes Historical Provider, MD  losartan (COZAAR) 50 MG tablet Take 50 mg by mouth daily.   Yes Historical Provider, MD  metFORMIN (GLUCOPHAGE) 1000 MG tablet Take 1,000 mg by mouth daily with breakfast.    Yes Historical Provider, MD  traMADol-acetaminophen (ULTRACET) 37.5-325 MG per tablet Take 1 tablet by mouth every 6 (six) hours as needed. Patient taking differently: Take 1 tablet by mouth every 6 (six) hours as needed for moderate pain.  03/26/14  Yes Larene Pickett, PA-C  Vitamin D, Ergocalciferol, (DRISDOL) 50000 UNITS CAPS capsule Take 1 capsule by mouth once a week. tuesdays 03/26/15  Yes Historical Provider, MD  methocarbamol (ROBAXIN) 500 MG tablet Take 1 tablet (500 mg total) by mouth 2 (two) times daily as needed. Patient taking differently: Take 500 mg by mouth 2 (two) times daily as needed for muscle spasms.  03/26/14   Larene Pickett, PA-C  promethazine (PHENERGAN) 25 MG suppository Place 1 suppository (25 mg total) rectally every 6 (six) hours as needed for nausea or vomiting. 05/09/15   Merrily Pew, MD  promethazine (PHENERGAN) 25 MG tablet Take 1 tablet (25 mg total) by mouth every 6 (six) hours as needed for nausea or vomiting. 05/09/15   Merrily Pew, MD    Family History Family History  Problem Relation Age of Onset  .  Hypertension Other   . Diabetes Other     Social History Social History  Substance Use Topics  . Smoking status: Never Smoker  . Smokeless tobacco: Never Used  . Alcohol use No     Allergies   Review of patient's allergies indicates no known allergies.  Review of Systems Review of Systems  Constitutional: Negative for chills, diaphoresis and fever.  Genitourinary: Positive for flank pain (bilateral) and frequency. Negative for dysuria and hematuria.   Physical Exam Updated Vital Signs BP 177/96 (BP Location:  Left Arm)   Pulse 81   Temp 98.2 F (36.8 C) (Oral)   Resp 16   Ht 5\' 6"  (1.676 m)   Wt 215 lb (97.5 kg)   LMP 04/23/2016 (Exact Date)   SpO2 100%   BMI 34.70 kg/m   Physical Exam  Constitutional: She is oriented to person, place, and time. She appears well-developed and well-nourished.  HENT:  Head: Normocephalic and atraumatic.  Eyes: Conjunctivae and EOM are normal. Pupils are equal, round, and reactive to light. Right eye exhibits no discharge. Left eye exhibits no discharge. No scleral icterus.  Neck: Normal range of motion. Neck supple. No JVD present.  Cardiovascular: Normal rate, regular rhythm, normal heart sounds and intact distal pulses.   No murmur heard. Pulmonary/Chest: Effort normal and breath sounds normal. No stridor. She has no wheezes. She has no rales. She exhibits no tenderness.  Abdominal: Soft. Bowel sounds are normal. She exhibits no distension and no mass. There is no tenderness.  Musculoskeletal: Normal range of motion. She exhibits no edema.  Mild bilateral paraspinal tenderness and muscle spasm No midline tenderness  Straight leg raise negative   Lymphadenopathy:    She has no cervical adenopathy.  Neurological: She is alert and oriented to person, place, and time. She has normal reflexes. No cranial nerve deficit. She exhibits normal muscle tone. Coordination normal.  Decreased sensation of lateral aspect of both feet   Skin: Skin is warm and dry. No rash noted.  Psychiatric: She has a normal mood and affect. Her behavior is normal. Judgment and thought content normal.  Nursing note and vitals reviewed.  ED Treatments / Results  Labs (all labs ordered are listed, but only abnormal results are displayed) Labs Reviewed  URINALYSIS, ROUTINE W REFLEX MICROSCOPIC (NOT AT [facility]) - Abnormal; Notable for the following:       Result Value   APPearance CLOUDY (*)    Glucose, UA >1000 (*)    Ketones, ur 15 (*)    All other components within normal limits    URINE MICROSCOPIC-ADD ON - Abnormal; Notable for the following:    Squamous Epithelial / LPF 0-5 (*)    Bacteria, UA FEW (*)    Crystals URIC ACID CRYSTALS (*)    All other components within normal limits  POC URINE PREG, ED   EKG  EKG Interpretation None      Radiology No results found.  Procedures Procedures  DIAGNOSTIC STUDIES:  Oxygen Saturation is 100% on RA, normal by my interpretation.    COORDINATION OF CARE:  12:23 AM Discussed treatment plan, which includes urinalysis, ibuprofen and flexiril with pt at bedside and pt agreed to plan.   Medications Ordered in ED Medications  ibuprofen (ADVIL,MOTRIN) tablet 800 mg (800 mg Oral Given 05/11/16 0040)  cyclobenzaprine (FLEXERIL) tablet 10 mg (10 mg Oral Given 05/11/16 0040)  oxyCODONE-acetaminophen (PERCOCET/ROXICET) 5-325 MG per tablet 1 tablet (1 tablet Oral Given 05/11/16 0305)   Initial Impression /  Assessment and Plan / ED Course  I have reviewed the triage vital signs and the nursing notes.  Pertinent labs & imaging results that were available during my care of the patient were reviewed by me and considered in my medical decision making (see chart for details).  Clinical Course    Upper lumbar paraspinal pain which appears to be musculoskeletal. There is palpable muscle spasm. Old records are reviewed and she did have an ED visit in 2015 for neck spasms. Also symptoms suggestive of urinary tract infection. Urinalysis will be obtained. She is given initial dose of ibuprofen and cyclobenzaprine.  She had moderate relief of pain with above noted treatment. Urinalysis shows uric acid crystals but no evidence of infection. She is discharged with prescriptions for naproxen, orphenadrine, and oxycodone have acetaminophen. Follow-up with PCP. Advised to use ice several times a day. Recommended he drink plenty of fluids with presence of crystaluria to try to prevent nephrolithiasis.  Final Clinical Impressions(s) / ED  Diagnoses   Final diagnoses:  Acute bilateral low back pain without sciatica  Uric acid crystalluria    New Prescriptions Discharge Medication List as of 05/11/2016  2:48 AM    START taking these medications   Details  naproxen (NAPROSYN) 500 MG tablet Take 1 tablet (500 mg total) by mouth 2 (two) times daily., Starting Thu 05/11/2016, Print    orphenadrine (NORFLEX) 100 MG tablet Take 1 tablet (100 mg total) by mouth 2 (two) times daily., Starting Thu 05/11/2016, Print    oxyCODONE-acetaminophen (PERCOCET) 5-325 MG tablet Take 1 tablet by mouth every 4 (four) hours as needed for moderate pain., Starting Thu 05/11/2016, Print       I personally performed the services described in this documentation, which was scribed in my presence. The recorded information has been reviewed and is accurate.     Delora Fuel, MD [id number]

## 2016-05-12 ENCOUNTER — Other Ambulatory Visit: Payer: Self-pay | Admitting: Internal Medicine

## 2016-05-12 DIAGNOSIS — Z1231 Encounter for screening mammogram for malignant neoplasm of breast: Secondary | ICD-10-CM

## 2016-05-16 ENCOUNTER — Ambulatory Visit: Payer: Medicaid Other

## 2016-05-18 ENCOUNTER — Ambulatory Visit
Admission: RE | Admit: 2016-05-18 | Discharge: 2016-05-18 | Disposition: A | Discharge status: Home / Self Care | Payer: Medicaid Other | Source: Ambulatory Visit | Attending: Internal Medicine | Admitting: Internal Medicine

## 2016-05-18 DIAGNOSIS — Z1231 Encounter for screening mammogram for malignant neoplasm of breast: Secondary | ICD-10-CM

## 2016-12-19 ENCOUNTER — Emergency Department (HOSPITAL_COMMUNITY)
Admission: EM | Admit: 2016-12-19 | Discharge: 2016-12-20 | Disposition: A | Discharge status: Home / Self Care | Payer: Medicaid Other | Attending: Emergency Medicine | Admitting: Emergency Medicine

## 2016-12-19 ENCOUNTER — Emergency Department (HOSPITAL_COMMUNITY): Payer: Medicaid Other

## 2016-12-19 ENCOUNTER — Encounter (HOSPITAL_COMMUNITY): Payer: Self-pay | Admitting: Emergency Medicine

## 2016-12-19 DIAGNOSIS — B349 Viral infection, unspecified: Secondary | ICD-10-CM | POA: Insufficient documentation

## 2016-12-19 DIAGNOSIS — R1011 Right upper quadrant pain: Secondary | ICD-10-CM

## 2016-12-19 DIAGNOSIS — E119 Type 2 diabetes mellitus without complications: Secondary | ICD-10-CM | POA: Insufficient documentation

## 2016-12-19 DIAGNOSIS — Z7982 Long term (current) use of aspirin: Secondary | ICD-10-CM | POA: Insufficient documentation

## 2016-12-19 DIAGNOSIS — R52 Pain, unspecified: Secondary | ICD-10-CM

## 2016-12-19 DIAGNOSIS — I10 Essential (primary) hypertension: Secondary | ICD-10-CM | POA: Diagnosis not present

## 2016-12-19 DIAGNOSIS — R935 Abnormal findings on diagnostic imaging of other abdominal regions, including retroperitoneum: Secondary | ICD-10-CM | POA: Insufficient documentation

## 2016-12-19 DIAGNOSIS — Z7984 Long term (current) use of oral hypoglycemic drugs: Secondary | ICD-10-CM | POA: Diagnosis not present

## 2016-12-19 LAB — COMPREHENSIVE METABOLIC PANEL
ALBUMIN: 3.7 g/dL (ref 3.5–5.0)
ALT: 29 U/L (ref 14–54)
ANION GAP: 10 (ref 5–15)
AST: 29 U/L (ref 15–41)
Alkaline Phosphatase: 138 U/L — ABNORMAL HIGH (ref 38–126)
BUN: 11 mg/dL (ref 6–20)
CHLORIDE: 99 mmol/L — AB (ref 101–111)
CO2: 23 mmol/L (ref 22–32)
Calcium: 9.5 mg/dL (ref 8.9–10.3)
Creatinine, Ser: 0.68 mg/dL (ref 0.44–1.00)
GFR calc Af Amer: >60 mL/min (ref >60)
GFR calc non Af Amer: >60 mL/min (ref >60)
GLUCOSE: 254 mg/dL — AB (ref 65–99)
Potassium: 3.7 mmol/L (ref 3.5–5.1)
SODIUM: 132 mmol/L — AB (ref 135–145)
Total Bilirubin: 0.8 mg/dL (ref 0.3–1.2)
Total Protein: 8.3 g/dL — ABNORMAL HIGH (ref 6.5–8.1)

## 2016-12-19 LAB — CBC
HCT: 37.7 % (ref 36.0–46.0)
HEMOGLOBIN: 12.3 g/dL (ref 12.0–15.0)
MCH: 23.6 pg — ABNORMAL LOW (ref 26.0–34.0)
MCHC: 32.6 g/dL (ref 30.0–36.0)
MCV: 72.2 fL — AB (ref 78.0–100.0)
Platelets: 230 10*3/uL (ref 150–400)
RBC: 5.22 MIL/uL — AB (ref 3.87–5.11)
RDW: 15 % (ref 11.5–15.5)
WBC: 10 10*3/uL (ref 4.0–10.5)

## 2016-12-19 LAB — CBG MONITORING, ED: Glucose-Capillary: 254 mg/dL — ABNORMAL HIGH (ref 65–99)

## 2016-12-19 LAB — LIPASE, BLOOD: LIPASE: 28 U/L (ref 11–51)

## 2016-12-19 LAB — INFLUENZA PANEL BY PCR (TYPE A & B)
Influenza A By PCR: NEGATIVE
Influenza B By PCR: NEGATIVE

## 2016-12-19 MED ORDER — IOPAMIDOL (ISOVUE-300) INJECTION 61%
100.0000 mL | Freq: Once | INTRAVENOUS | Status: AC | PRN
  Administered 2016-12-19: 100 mL via INTRAVENOUS

## 2016-12-19 MED ORDER — HYDROMORPHONE HCL 1 MG/ML IJ SOLN
1.0000 mg | Freq: Once | INTRAMUSCULAR | Status: AC
Start: 2016-12-19 — End: 2016-12-19
  Administered 2016-12-19: 1 mg via INTRAVENOUS
  Filled 2016-12-19: qty 1

## 2016-12-19 MED ORDER — DEXTROSE 5 % IV SOLN: 2.0000 g | INTRAVENOUS | Status: DC

## 2016-12-19 MED ORDER — ONDANSETRON HCL 4 MG/2ML IJ SOLN
4.0000 mg | Freq: Once | INTRAMUSCULAR | Status: AC
  Administered 2016-12-19: 4 mg via INTRAVENOUS
  Filled 2016-12-19: qty 2

## 2016-12-19 MED ORDER — SODIUM CHLORIDE 0.9 % IV BOLUS (SEPSIS)
1000.0000 mL | Freq: Once | INTRAVENOUS | Status: AC
  Administered 2016-12-19: 1000 mL via INTRAVENOUS

## 2016-12-19 MED ORDER — IOPAMIDOL (ISOVUE-300) INJECTION 61%
INTRAVENOUS | Status: AC
  Filled 2016-12-19: qty 100

## 2016-12-19 NOTE — ED Notes (Signed)
Asked pt if she could give a urine sample, but said she's not able to. 

## 2016-12-19 NOTE — ED Provider Notes (Signed)
Flu like sxs.  Incidental liver mass on [place].  Awaits abd/pelvis CT.   12:37 AM Patient with cold symptoms presenting to the ER today. She was initially seen and evaluate by Clayton Bibles, PA-C.  she had a negative influenza panel. She was found to be mildly hyperglycemia with normal anion gap. A chest x-ray without any acute finding. A right upper quadrant abdominal ultrasound shown multiple solid masses in the liver. Follow-up with an abdominal and pelvis CT scan that demonstrated multiple liver masses concerning for metastatic disease. Patient also have a massively enlarged uterus which extends to the level of the umbilicus. Multiple enhancing mass within the uterus as well as a right adnexal mass.  I did discuss this with pt.  She does have a PCP.  1:06 AM I have reached out to our oncall OBGYN Dr. Ralene Muskrat who recommend pt to call Oncology OBGYN at [phone].  Pt can also reach out to benign GYN at [phone].  I stress the importance of close follow up for further evaluation of her abnormal CT scan concerning for malignancy.  Pt voice understanding and agrees with plan.  All questions answered to pt's satisfaction.  Of note, pt did have blood in her urine, likely due to currently being on her menstruation.   BP 136/80 (BP Location: Left Arm)   Pulse 95   Temp 98.1 F (36.7 C) (Oral)   Resp 18   Ht 5\' 6"  (1.676 m)   Wt 97.1 kg   LMP 12/19/2016   SpO2 100%   BMI 34.54 kg/m   Results for orders placed or performed during the hospital encounter of 12/19/16  Lipase, blood  Result Value Ref Range   Lipase 28 11 - 51 U/L  Comprehensive metabolic panel  Result Value Ref Range   Sodium 132 (L) 135 - 145 mmol/L   Potassium 3.7 3.5 - 5.1 mmol/L   Chloride 99 (L) 101 - 111 mmol/L   CO2 23 22 - 32 mmol/L   Glucose, Bld 254 (H) 65 - 99 mg/dL   BUN 11 6 - 20 mg/dL   Creatinine, Ser 0.68 0.44 - 1.00 mg/dL   Calcium 9.5 8.9 - 10.3 mg/dL   Total Protein 8.3 (H) 6.5 - 8.1 g/dL   Albumin 3.7  3.5 - 5.0 g/dL   AST 29 15 - 41 U/L   ALT 29 14 - 54 U/L   Alkaline Phosphatase 138 (H) 38 - 126 U/L   Total Bilirubin 0.8 0.3 - 1.2 mg/dL   GFR calc non Af Amer >60 >60 mL/min   GFR calc Af Amer >60 >60 mL/min   Anion gap 10 5 - 15  CBC  Result Value Ref Range   WBC 10.0 4.0 - 10.5 K/uL   RBC 5.22 (H) 3.87 - 5.11 MIL/uL   Hemoglobin 12.3 12.0 - 15.0 g/dL   HCT 37.7 36.0 - 46.0 %   MCV 72.2 (L) 78.0 - 100.0 fL   MCH 23.6 (L) 26.0 - 34.0 pg   MCHC 32.6 30.0 - 36.0 g/dL   RDW 15.0 11.5 - 15.5 %   Platelets 230 150 - 400 K/uL  Urinalysis, Routine w reflex microscopic  Result Value Ref Range   Color, Urine YELLOW YELLOW   APPearance CLEAR CLEAR   Specific Gravity, Urine >1.046 (H) 1.005 - 1.030   pH 5.0 5.0 - 8.0   Glucose, UA >=500 (A) NEGATIVE mg/dL   Hgb urine dipstick LARGE (A) NEGATIVE   Bilirubin Urine NEGATIVE NEGATIVE  Ketones, ur 5 (A) NEGATIVE mg/dL   Protein, ur 30 (A) NEGATIVE mg/dL   Nitrite NEGATIVE NEGATIVE   Leukocytes, UA NEGATIVE NEGATIVE   RBC / HPF TOO NUMEROUS TO COUNT 0 - 5 RBC/hpf   WBC, UA 0-5 0 - 5 WBC/hpf   Bacteria, UA NONE SEEN NONE SEEN   Squamous Epithelial / LPF 0-5 (A) NONE SEEN   Mucous PRESENT   Influenza panel by PCR (type A & B)  Result Value Ref Range   Influenza A By PCR NEGATIVE NEGATIVE   Influenza B By PCR NEGATIVE NEGATIVE  CBG monitoring, ED  Result Value Ref Range   Glucose-Capillary 254 (H) 65 - 99 mg/dL   Dg Chest 2 View  Result Date: 12/19/2016 CLINICAL DATA:  Sternal chest pain with weakness EXAM: CHEST  2 VIEW COMPARISON:  05/16/2014 FINDINGS: The heart size and mediastinal contours are within normal limits. Both lungs are clear. The visualized skeletal structures demonstrate degenerative changes. IMPRESSION: No active cardiopulmonary disease. Electronically Signed   By: Donavan Foil M.D.   On: 12/19/2016 21:27   Ct Abdomen Pelvis W Contrast  Result Date: 12/19/2016 CLINICAL DATA:  Abdomen pain and dizziness EXAM: CT  ABDOMEN AND PELVIS WITH CONTRAST TECHNIQUE: Multidetector CT imaging of the abdomen and pelvis was performed using the standard protocol following bolus administration of intravenous contrast. CONTRAST:  178mL ISOVUE-300 IOPAMIDOL (ISOVUE-300) INJECTION 61% COMPARISON:  Ultrasound 12/19/2016 FINDINGS: Lower chest: Lung bases demonstrate no acute consolidation or pleural effusion. The heart is nonenlarged. Hepatobiliary: Hepatic steatosis. Multiple peripherally enhancing masses are present throughout the liver, measuring up to 3.3 cm. No biliary dilatation. No calcified gallstones. Pancreas: Unremarkable. No pancreatic ductal dilatation or surrounding inflammatory changes. Spleen: Normal in size without focal abnormality. Adrenals/Urinary Tract: Adrenal glands are unremarkable. Kidneys are normal, without renal calculi, focal lesion, or hydronephrosis. Bladder is unremarkable. Stomach/Bowel: Stomach is within normal limits. Appendix appears normal. No evidence of bowel wall thickening, distention, or inflammatory changes. Vascular/Lymphatic: 8 mm soft tissue nodule or lymph node in the lower pelvis. Small pelvic sidewall lymph nodes, subcentimeter Reproductive: The uterus is markedly enlarged and extend superiorly to the just above the umbilicus. Multiple enhancing masses are present within the uterus. Oblong hypodensity in the vagina with air likely represents a tampon. There is a right pelvic heterogenously enhancing mass measuring 8.6 by 6.6 cm, uncertain if this is exophytic from the enlarged uterus or if this represents a right adnexal mass. Other: Small free fluid in the right at adnexa.  No free air. Musculoskeletal: Degenerative changes of the spine. No acute or suspicious bone lesions. IMPRESSION: 1. Hepatic steatosis with multiple masses, corresponding to the ultrasound abnormality. Findings are concerning for metastatic disease. 2. Massively enlarged uterus which extends to the level of the umbilicus.  Multiple enhancing masses within the uterus could represent fibroids. Heterogenous hyperenhancing mass in the right pelvis measuring up to 8.6 cm, uncertain if this is exophytic from the uterus or represents a right adnexal mass. Surgical consultation is suggested. 3. Small amount of free fluid in the right adnexa Electronically Signed   By: Donavan Foil M.D.   On: 12/19/2016 23:09   US Abdomen Limited Ruq  Result Date: 12/19/2016 CLINICAL DATA:  Right upper quadrant pain for 2 days. EXAM: US ABDOMEN LIMITED - RIGHT UPPER QUADRANT COMPARISON:  None. FINDINGS: Gallbladder: No gallstones or wall thickening visualized. No sonographic Murphy sign noted by sonographer. Common bile duct: Diameter: 2.5 mm Liver: Multiple solid masses are present in the  liver, new from prior studies of 2013 and 2014. These measure up to 3.7 cm. IMPRESSION: 1. New solid masses in the liver, suspicious. Recommend CT or MRI with contrast. 2. Normal gallbladder and bile ducts. 3. These results will be called to the ordering clinician or representative by the Radiologist Assistant, and communication documented in the PACS or zVision Dashboard. Electronically Signed   By: Andreas Newport M.D.   On: 12/19/2016 21:45      Domenic Moras, PA-C 12/20/16 0148    Domenic Moras, PA-C 12/20/16 [id number]    Gareth Morgan, MD 12/20/16 1409

## 2016-12-19 NOTE — ED Notes (Signed)
Pt states that she has been having pain in the epigastric pain that radiates to her lower back. Pt denies nausea, vomiting. Pt denies pain or burning during urination. Pt does report urinary frequency.

## 2016-12-19 NOTE — ED Provider Notes (Signed)
[facility] Provider Note   CSN: [id number] Arrival date & time: 12/19/16  [place]     History   Chief Complaint Chief Complaint  Patient presents with  . Abdominal Pain  . Back Pain    HPI Hayley Collins is a 46 y.o. female.  HPI   Pt with hx DM p/w chills, myalgias, sore throat, ear pain that began today and headache, back pain, RUQ abdominal pain that began two days ago.  Pain is constant and described as sore.  Has had decreased stooling, no BM since yesterday (usually 7-8 daily when taking metformin).  She continues to have bowel movements.  Denies cough, chest pain, urinary or vaginal symptoms.  Denies loss of control of bowel or bladder, weakness of numbness of the extremities, saddle anesthesia.  No hx CA, IVDU.    Past Medical History:  Diagnosis Date  . Diabetes mellitus   . Fatty liver   . GERD (gastroesophageal reflux disease)   . Hypertension     There are no active problems to display for this patient.   History reviewed. No pertinent surgical history.  OB History    No data available       Home Medications    Prior to Admission medications   Medication Sig Start Date End Date Taking? Authorizing Provider  albuterol (PROVENTIL HFA;VENTOLIN HFA) 108 (90 BASE) MCG/ACT inhaler Inhale 2 puffs into the lungs every 6 (six) hours as needed for wheezing or shortness of breath.   Yes Historical Provider, MD  aspirin 325 MG tablet Take 325-650 mg by mouth daily as needed for headache (high blood pressure).    Yes Historical Provider, MD  beclomethasone (QVAR) 80 MCG/ACT inhaler Inhale 2 puffs into the lungs 2 (two) times daily as needed (asthma).    Yes Historical Provider, MD  ibuprofen (ADVIL,MOTRIN) 800 MG tablet Take 800 mg by mouth every 8 (eight) hours as needed.   Yes Historical Provider, MD  losartan (COZAAR) 50 MG tablet Take 50 mg by mouth daily.   Yes Historical Provider, MD  metFORMIN (GLUCOPHAGE) 1000 MG tablet Take 1,000 mg by mouth daily  with breakfast.    Yes Historical Provider, MD  tiZANidine (ZANAFLEX) 4 MG tablet Take 4 mg by mouth every 6 (six) hours as needed for muscle spasms.   Yes Historical Provider, MD  traMADol-acetaminophen (ULTRACET) 37.5-325 MG per tablet Take 1 tablet by mouth every 6 (six) hours as needed. Patient not taking: Reported on 12/19/2016 03/26/14   Larene Pickett, PA-C    Family History Family History  Problem Relation Age of Onset  . Hypertension Other   . Diabetes Other     Social History Social History  Substance Use Topics  . Smoking status: Never Smoker  . Smokeless tobacco: Never Used  . Alcohol use No     Allergies   Patient has no known allergies.   Review of Systems Review of Systems  All other systems reviewed and are negative.    Physical Exam Updated Vital Signs BP 139/83 (BP Location: Left Arm)   Pulse 105   Temp 100.6 F (38.1 C) (Oral)   Resp 18   Ht 5\' 6"  (1.676 m)   Wt 97.1 kg   LMP 12/19/2016   SpO2 99%   BMI 34.54 kg/m   Physical Exam  Constitutional: She appears well-developed and well-nourished. No distress.  HENT:  Head: Normocephalic and atraumatic.  Neck: Neck supple.  Cardiovascular: Normal rate and regular rhythm.   Pulmonary/Chest:  Effort normal and breath sounds normal. No respiratory distress. She has no wheezes. She has no rales.  Abdominal: Soft. She exhibits no distension. There is tenderness in the right upper quadrant. There is no rebound and no guarding.  Neurological: She is alert.  Skin: She is not diaphoretic.  Nursing note and vitals reviewed.    ED Treatments / Results  Labs (all labs ordered are listed, but only abnormal results are displayed) Labs Reviewed  COMPREHENSIVE METABOLIC PANEL - Abnormal; Notable for the following:       Result Value   Sodium 132 (*)    Chloride 99 (*)    Glucose, Bld 254 (*)    Total Protein 8.3 (*)    Alkaline Phosphatase 138 (*)    All other components within normal limits  CBC -  Abnormal; Notable for the following:    RBC 5.22 (*)    MCV 72.2 (*)    MCH 23.6 (*)    All other components within normal limits  CBG MONITORING, ED - Abnormal; Notable for the following:    Glucose-Capillary 254 (*)    All other components within normal limits  LIPASE, BLOOD  URINALYSIS, ROUTINE W REFLEX MICROSCOPIC  INFLUENZA PANEL BY PCR (TYPE A & B)    EKG  EKG Interpretation None       Radiology Dg Chest 2 View  Result Date: 12/19/2016 CLINICAL DATA:  Sternal chest pain with weakness EXAM: CHEST  2 VIEW COMPARISON:  05/16/2014 FINDINGS: The heart size and mediastinal contours are within normal limits. Both lungs are clear. The visualized skeletal structures demonstrate degenerative changes. IMPRESSION: No active cardiopulmonary disease. Electronically Signed   By: Donavan Foil M.D.   On: 12/19/2016 21:27   US Abdomen Limited Ruq  Result Date: 12/19/2016 CLINICAL DATA:  Right upper quadrant pain for 2 days. EXAM: US ABDOMEN LIMITED - RIGHT UPPER QUADRANT COMPARISON:  None. FINDINGS: Gallbladder: No gallstones or wall thickening visualized. No sonographic Murphy sign noted by sonographer. Common bile duct: Diameter: 2.5 mm Liver: Multiple solid masses are present in the liver, new from prior studies of 2013 and 2014. These measure up to 3.7 cm. IMPRESSION: 1. New solid masses in the liver, suspicious. Recommend CT or MRI with contrast. 2. Normal gallbladder and bile ducts. 3. These results will be called to the ordering clinician or representative by the Radiologist Assistant, and communication documented in the PACS or zVision Dashboard. Electronically Signed   By: Andreas Newport M.D.   On: 12/19/2016 21:45    Procedures Procedures (including critical care time)  Medications Ordered in ED Medications  sodium chloride 0.9 % bolus 1,000 mL (1,000 mLs Intravenous New Bag/Given 12/19/16 2152)  ondansetron (ZOFRAN) injection 4 mg (4 mg Intravenous Given 12/19/16 2152)    HYDROmorphone (DILAUDID) injection 1 mg (1 mg Intravenous Given 12/19/16 2152)  iopamidol (ISOVUE-300) 61 % injection 100 mL (100 mLs Intravenous Contrast Given 12/19/16 2222)     Initial Impression / Assessment and Plan / ED Course  I have reviewed the triage vital signs and the nursing notes.  Pertinent labs & imaging results that were available during my care of the patient were reviewed by me and considered in my medical decision making (see chart for details).     Pt with 3 days back pain, headaches, abdominal pain, constipation.  1 day of chills, myalgias, sore throat, mild SOB.  Labs significant for hyperglycemia, mild hyponatremia.  CXR negative.  [place] with concerning finding of liver masses.  Discussed all results with patient.  Pending UA, flu swab, and CT abd/pelvis at change of shift.  Discussed pt with Domenic Moras, PA-C, who assumes care of patient.    Final Clinical Impressions(s) / ED Diagnoses   Final diagnoses:  Pain  Myalgia  RUQ pain    New Prescriptions New Prescriptions   No medications on file     Clayton Bibles, PA-C 12/19/16 [place], MD 12/20/16 1859

## 2016-12-19 NOTE — ED Notes (Signed)
Pt. Asked to give urine specimen. Pt can't void at present moment. Nurse Aware

## 2016-12-19 NOTE — ED Triage Notes (Signed)
Pt reports abd pain , back pain and dizziness, weak. Hx type II diabetes. sts has not taker her metformin today. Alert and oriented x 4.

## 2016-12-20 LAB — URINALYSIS, ROUTINE W REFLEX MICROSCOPIC
BILIRUBIN URINE: NEGATIVE
Bacteria, UA: NONE SEEN
Ketones, ur: 5 mg/dL — AB
LEUKOCYTES UA: NEGATIVE
NITRITE: NEGATIVE
Protein, ur: 30 mg/dL — AB
pH: 5 (ref 5.0–8.0)

## 2016-12-20 MED ORDER — TRAMADOL HCL 50 MG PO TABS: 50.0000 mg | ORAL_TABLET | Freq: Four times a day (QID) | ORAL | 0 refills | Status: DC | PRN

## 2016-12-20 NOTE — Discharge Instructions (Signed)
You have been evaluated for your abdominal pain and cold symptoms.  Your CT scan today shows several masses and changes in your uterus, and right ovary region, as well as masses in your liver.  Please call [facility] tomorrow at [phone] for close follow up.  If you are unable to get follow up at [facility], then you may call [phone] to ask for help with scheduling.  Take Tramadol as needed for pain.  Return if you have any concerns.

## 2016-12-26 ENCOUNTER — Ambulatory Visit: Payer: Medicaid Other | Attending: Gynecologic Oncology | Admitting: Gynecologic Oncology

## 2016-12-26 ENCOUNTER — Other Ambulatory Visit: Payer: Medicaid Other

## 2016-12-26 ENCOUNTER — Encounter: Payer: Self-pay | Admitting: Gynecologic Oncology

## 2016-12-26 VITALS — BP 146/73 | HR 93 | Temp 97.7°F | Resp 20 | Ht 66.14 in | Wt 207.3 lb

## 2016-12-26 DIAGNOSIS — Z7982 Long term (current) use of aspirin: Secondary | ICD-10-CM | POA: Diagnosis not present

## 2016-12-26 DIAGNOSIS — K219 Gastro-esophageal reflux disease without esophagitis: Secondary | ICD-10-CM | POA: Diagnosis not present

## 2016-12-26 DIAGNOSIS — R160 Hepatomegaly, not elsewhere classified: Secondary | ICD-10-CM | POA: Diagnosis not present

## 2016-12-26 DIAGNOSIS — N839 Noninflammatory disorder of ovary, fallopian tube and broad ligament, unspecified: Secondary | ICD-10-CM | POA: Diagnosis not present

## 2016-12-26 DIAGNOSIS — Z8249 Family history of ischemic heart disease and other diseases of the circulatory system: Secondary | ICD-10-CM | POA: Insufficient documentation

## 2016-12-26 DIAGNOSIS — E11319 Type 2 diabetes mellitus with unspecified diabetic retinopathy without macular edema: Secondary | ICD-10-CM

## 2016-12-26 DIAGNOSIS — N838 Other noninflammatory disorders of ovary, fallopian tube and broad ligament: Secondary | ICD-10-CM

## 2016-12-26 DIAGNOSIS — E669 Obesity, unspecified: Secondary | ICD-10-CM | POA: Diagnosis not present

## 2016-12-26 DIAGNOSIS — E1165 Type 2 diabetes mellitus with hyperglycemia: Secondary | ICD-10-CM | POA: Insufficient documentation

## 2016-12-26 DIAGNOSIS — Z7984 Long term (current) use of oral hypoglycemic drugs: Secondary | ICD-10-CM | POA: Diagnosis not present

## 2016-12-26 DIAGNOSIS — N852 Hypertrophy of uterus: Secondary | ICD-10-CM

## 2016-12-26 DIAGNOSIS — K769 Liver disease, unspecified: Secondary | ICD-10-CM | POA: Insufficient documentation

## 2016-12-26 DIAGNOSIS — Z833 Family history of diabetes mellitus: Secondary | ICD-10-CM | POA: Insufficient documentation

## 2016-12-26 DIAGNOSIS — R200 Anesthesia of skin: Secondary | ICD-10-CM | POA: Diagnosis not present

## 2016-12-26 DIAGNOSIS — M549 Dorsalgia, unspecified: Secondary | ICD-10-CM | POA: Diagnosis not present

## 2016-12-26 DIAGNOSIS — E119 Type 2 diabetes mellitus without complications: Secondary | ICD-10-CM | POA: Insufficient documentation

## 2016-12-26 DIAGNOSIS — I10 Essential (primary) hypertension: Secondary | ICD-10-CM | POA: Diagnosis not present

## 2016-12-26 DIAGNOSIS — Z79899 Other long term (current) drug therapy: Secondary | ICD-10-CM | POA: Insufficient documentation

## 2016-12-26 DIAGNOSIS — R1900 Intra-abdominal and pelvic swelling, mass and lump, unspecified site: Secondary | ICD-10-CM

## 2016-12-26 NOTE — Progress Notes (Signed)
Consult Note: Gyn-Onc  Consult was requested by Dr. Penny Pia for the evaluation of Hayley Collins 46 y.o. female  CC:  Chief Complaint  Patient presents with  . pelvic and hepatic masses    Assessment/Plan:  Hayley Collins  is a 46 y.o.  year old with a large uterus (possibly fibroids) and a solid appearing right adnexal mass (either pedunculated fibroid vs ovarian mass) in the setting of multiple hepatic masses that are concerning for metastatic disease.  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 She also is obese.  She feels overwhelmed by the information given to her. I spent 60 minutes with her husband and her today reviewing her scans and performing examinations and discussing options.  I discussed that I have some concerns that the right ovarian mass may be malignant, and if so, she may have widely metastatic disease. I am also very concerned that this might represent a primary GI process (she has a right sided solid mass and liver metastases).  I first recommend sampling of the liver lesions for pathology. Dr Laurence Ferrari from IR feels that these are amenable to biopsy. I told her to stop taking ASA (she informs me that she hasn't taken it for several months). If these lesions are malignant and gyn primary, given that her pelvic pathology is asymptomatic and the intraparenchymal lesions are too large to render a debulking "optimal", I would recommend primary chemotherapy. Alternatively, if the liver biopsy were benign, I would recommend proceeding with ex lap, abdominal hysterectomy, RSO and possible staging. Hayley Collins was overwhelmed and unhappy about the prospect of losing her uterus as it is not giving her any adverse symptoms. I discussed that if her biopsy returned benign from the liver, we could revisit whether surgery would require hysterectomy or not (I would first want to sample the  endometrium of the uterus). Certainly, given the solid appearing nature of the ovary, I would recommend at least ex lap and RSO.  We will draw tumor markers today (including GI, germ cell and stromal markers).  She will need to see her PCP, Dr Nolene Ebbs, in order to become optimized from the standpoint of her diabetes (HbA1c drawn today).  I discussed that surgery in patients who have poorly controlled diabetes is associated with substantial perioperative risks, particularly wound breakdown and infection.  She declined me moving forward with a surgical date today.  Instead she is electing to see me back in 2-3 weeks after she has had attempted biopsy of the liver lesions and discussions with Dr [name] about optimizing her diabetes. At that time we will discuss test results and plan for the next step in treatment.    HPI: Hayley Collins is a 46 year old [id number] who is seen in consultation at the request of Dr Penny Pia for an enlarged pelvic mass and right adnexal mass and hepatic lesions.  The patient was seen in the ED on 12/19/16 when she was feeling "dizzy". She also reported upper back pains (thoracic). As part of her general workup she received a CT abdo/pelvis which showed the uterus is markedly enlarged and extend superiorly to the just above the umbilicus. Multiple enhancing masses are present within the uterus. There is a right pelvic heterogenously enhancing mass measuring 0.9FG 6.6 cm, uncertain if this is exophytic from the enlarged uterus or if this represents a right adnexal mass. Hepatic steatosis. Multiple peripherally enhancing masses are present throughout the liver,  measuring up to 3.3 cm.  She denies abnormal uterine bleeding. She has regular cyclic periods. She has become "menopausal" with spacing of her menses. She denies abdominal pain or bloating or early satiety. She denies change in bladder or bowel habit.  The patient has never had a colonoscopy.  The patient has a poor  general health story with obesity and type II DM for which she takes Metformin. She reports that she has been prescribed other medications in the past but declined taking them. Her blood glucose in the ER in March, 2018 was 235 which was similar to a value drawn in 2014.  She admits to poor diet control.  She has known diabetic neuropathy and retinopathy.   She also has substantial back pain, degeneration and chronic sciatica in the right with numbness in the right thigh.    She has not had abdominal surgery before.  Current Meds:  Outpatient Encounter Prescriptions as of 12/26/2016  Medication Sig  . albuterol (PROVENTIL HFA;VENTOLIN HFA) 108 (90 BASE) MCG/ACT inhaler Inhale 2 puffs into the lungs every 6 (six) hours as needed for wheezing or shortness of breath.  Marland Kitchen aspirin 325 MG tablet Take 325-650 mg by mouth daily as needed for headache (high blood pressure).   . beclomethasone (QVAR) 80 MCG/ACT inhaler Inhale 2 puffs into the lungs 2 (two) times daily as needed (asthma).   . cetirizine (ZYRTEC) 10 MG tablet take 1 tablet by mouth once daily if needed  . gabapentin (NEURONTIN) 300 MG capsule take 2 capsules by mouth three times a day  . glimepiride (AMARYL) 4 MG tablet Take 4 mg by mouth daily.  Marland Kitchen ibuprofen (ADVIL,MOTRIN) 800 MG tablet Take 800 mg by mouth every 8 (eight) hours as needed.  Marland Kitchen losartan (COZAAR) 50 MG tablet Take 50 mg by mouth daily.  . metFORMIN (GLUCOPHAGE) 1000 MG tablet Take 1,000 mg by mouth daily with breakfast.   . tiZANidine (ZANAFLEX) 4 MG tablet Take 4 mg by mouth every 6 (six) hours as needed for muscle spasms.  . traMADol (ULTRAM) 50 MG tablet Take 1 tablet (50 mg total) by mouth every 6 (six) hours as needed. (Patient not taking: Reported on 12/26/2016)  . traMADol-acetaminophen (ULTRACET) 37.5-325 MG per tablet Take 1 tablet by mouth every 6 (six) hours as needed. (Patient not taking: Reported on 12/19/2016)   No facility-administered encounter medications on file  as of 12/26/2016.     Allergy: No Known Allergies  Social Hx:   Social History   Social History  . Marital status: Married    Spouse name: N/A  . Number of children: N/A  . Years of education: N/A   Occupational History  . Not on file.   Social History Main Topics  . Smoking status: Never Smoker  . Smokeless tobacco: Never Used  . Alcohol use No  . Drug use: No  . Sexual activity: Not on file   Other Topics Concern  . Not on file   Social History Narrative  . No narrative on file    Past Surgical Hx: History reviewed. No pertinent surgical history.  Past Medical Hx:  Past Medical History:  Diagnosis Date  . Diabetes mellitus   . Fatty liver   . GERD (gastroesophageal reflux disease)   . Hypertension     Past Gynecological History:  SVD x 1 Patient's last menstrual period was 12/19/2016.  Family Hx:  Family History  Problem Relation Age of Onset  . Hypertension Other   . Diabetes  Other     Review of Systems:  Constitutional  Feels intermitently dizzy   ENT Normal appearing ears and nares bilaterally Skin/Breast  No rash, sores, jaundice, itching, dryness Cardiovascular  No chest pain, shortness of breath, or edema  Pulmonary  No cough or wheeze.  Gastro Intestinal  No nausea, vomitting, or diarrhoea. No bright red blood per rectum, no abdominal pain, change in bowel movement, or constipation.  Genito Urinary  No frequency, urgency, dysuria, no abnormal menses Musculo Skeletal  + chronic back pain Neurologic  + bilateral foot neuropathy and right thigh and leg numbness and sciatica Psychology  No depression, anxiety, insomnia.   Vitals:  Blood pressure (!) 146/73, pulse 93, temperature 97.7 F (36.5 C), resp. rate 20, height 5' 6.14" (1.68 m), weight 207 lb 4.8 oz (94 kg), last menstrual period 12/19/2016, SpO2 100 %.  Physical Exam: WD in NAD Neck  Supple NROM, without any enlargements.  Lymph Node Survey No cervical supraclavicular or  inguinal adenopathy Cardiovascular  Pulse normal rate, regularity and rhythm. S1 and S2 normal.  Lungs  Clear to auscultation bilateraly, without wheezes/crackles/rhonchi. Good air movement.  Skin  No rash/lesions/breakdown  Psychiatry  Alert and oriented to person, place, and time  Abdomen  Normoactive bowel sounds, abdomen soft, non-tender and obese (pannus) without evidence of hernia. Uterus palpable in upper abdomen to above level of umbilicus. Back No CVA tenderness Genito Urinary  Vulva/vagina: Normal external female genitalia.  No lesions. No discharge or bleeding.  Bladder/urethra:  No lesions or masses, well supported bladder  Vagina: normal  Cervix: Normal appearing, no lesions.  Uterus:  Bulky, minimally mobile due to size  Adnexa: unable to discretely appreciate right ovarian mass  Rectal  Good tone, no masses no cul de sac nodularity.  Extremities  No bilateral cyanosis, clubbing or edema.  60 minutes was spent with the patient with >50% spent on direct face to face counseling.  Donaciano Eva, MD  12/26/2016, 4:03 PM

## 2016-12-26 NOTE — Patient Instructions (Signed)
Dr. Denman George will call with  Lab results when available.  She will see you on April 4th at 0900 to discuss treatment plan. .  Please arrive at [facility] to check in.

## 2016-12-27 ENCOUNTER — Telehealth: Payer: Self-pay | Admitting: Gynecologic Oncology

## 2016-12-27 ENCOUNTER — Other Ambulatory Visit: Payer: Self-pay | Admitting: Radiology

## 2016-12-27 ENCOUNTER — Telehealth: Payer: Self-pay

## 2016-12-27 LAB — CEA (IN HOUSE-CHCC)

## 2016-12-27 LAB — HCG, SERUM, QUALITATIVE: hCG,Beta Subunit,Qual,Serum: NEGATIVE m[IU]/mL (ref <6)

## 2016-12-27 LAB — AFP TUMOR MARKER: AFP, Serum, Tumor Marker: 2.4 ng/mL (ref 0.0–8.3)

## 2016-12-27 LAB — CA 125: Cancer Antigen (CA) 125: 47.9 U/mL — ABNORMAL HIGH (ref 0.0–38.1)

## 2016-12-27 LAB — HEMOGLOBIN A1C
Est. average glucose Bld gHb Est-mCnc: 263 mg/dL
Hemoglobin A1c: 10.8 % — ABNORMAL HIGH (ref 4.8–5.6)

## 2016-12-27 NOTE — Telephone Encounter (Signed)
Was informed by interventional radiology that patient declined scheduling liver biopsy as she "felt she was being rushed and wanted to wait a month". I called the patient twice today and left a message to call our office.  It is critical that she proceed with biopsy as these lesions are likely malignant and delay in diagnosis and treatment will impact prognosis.  Donaciano Eva, MD

## 2016-12-27 NOTE — Telephone Encounter (Signed)
Felisha in central scheduling stated that the procedure was in reviw for radiologist.  Stated that this needs to be done by 01-17-17 visit.

## 2016-12-28 ENCOUNTER — Other Ambulatory Visit: Payer: Self-pay | Admitting: Student

## 2016-12-28 ENCOUNTER — Other Ambulatory Visit: Payer: Self-pay | Admitting: General Surgery

## 2016-12-28 ENCOUNTER — Other Ambulatory Visit: Payer: Self-pay | Admitting: Radiology

## 2016-12-28 LAB — INHIBIN B: INHIBIN B: 44.3 pg/mL

## 2016-12-29 ENCOUNTER — Ambulatory Visit (HOSPITAL_COMMUNITY)
Admission: RE | Admit: 2016-12-29 | Discharge: 2016-12-29 | Disposition: A | Discharge status: Home / Self Care | Payer: Medicaid Other | Source: Ambulatory Visit | Attending: Gynecologic Oncology | Admitting: Gynecologic Oncology

## 2016-12-29 ENCOUNTER — Encounter (HOSPITAL_COMMUNITY): Payer: Self-pay

## 2016-12-29 DIAGNOSIS — K219 Gastro-esophageal reflux disease without esophagitis: Secondary | ICD-10-CM | POA: Diagnosis not present

## 2016-12-29 DIAGNOSIS — R1900 Intra-abdominal and pelvic swelling, mass and lump, unspecified site: Secondary | ICD-10-CM | POA: Diagnosis not present

## 2016-12-29 DIAGNOSIS — Z7984 Long term (current) use of oral hypoglycemic drugs: Secondary | ICD-10-CM | POA: Insufficient documentation

## 2016-12-29 DIAGNOSIS — R160 Hepatomegaly, not elsewhere classified: Secondary | ICD-10-CM | POA: Diagnosis present

## 2016-12-29 DIAGNOSIS — Z7982 Long term (current) use of aspirin: Secondary | ICD-10-CM | POA: Insufficient documentation

## 2016-12-29 DIAGNOSIS — Z79899 Other long term (current) drug therapy: Secondary | ICD-10-CM | POA: Insufficient documentation

## 2016-12-29 DIAGNOSIS — K760 Fatty (change of) liver, not elsewhere classified: Secondary | ICD-10-CM | POA: Insufficient documentation

## 2016-12-29 DIAGNOSIS — N852 Hypertrophy of uterus: Secondary | ICD-10-CM | POA: Diagnosis not present

## 2016-12-29 DIAGNOSIS — Z8249 Family history of ischemic heart disease and other diseases of the circulatory system: Secondary | ICD-10-CM | POA: Diagnosis not present

## 2016-12-29 DIAGNOSIS — C787 Secondary malignant neoplasm of liver and intrahepatic bile duct: Secondary | ICD-10-CM | POA: Diagnosis not present

## 2016-12-29 DIAGNOSIS — I10 Essential (primary) hypertension: Secondary | ICD-10-CM | POA: Insufficient documentation

## 2016-12-29 DIAGNOSIS — E119 Type 2 diabetes mellitus without complications: Secondary | ICD-10-CM | POA: Insufficient documentation

## 2016-12-29 DIAGNOSIS — Z833 Family history of diabetes mellitus: Secondary | ICD-10-CM | POA: Insufficient documentation

## 2016-12-29 LAB — CBC WITH DIFFERENTIAL/PLATELET
BASOS PCT: 0 %
Basophils Absolute: 0 10*3/uL (ref 0.0–0.1)
EOS ABS: 0.1 10*3/uL (ref 0.0–0.7)
Eosinophils Relative: 1 %
HCT: 36.4 % (ref 36.0–46.0)
Hemoglobin: 11.5 g/dL — ABNORMAL LOW (ref 12.0–15.0)
LYMPHS ABS: 2.3 10*3/uL (ref 0.7–4.0)
Lymphocytes Relative: 31 %
MCH: 23 pg — AB (ref 26.0–34.0)
MCHC: 31.6 g/dL (ref 30.0–36.0)
MCV: 72.9 fL — ABNORMAL LOW (ref 78.0–100.0)
MONO ABS: 0.6 10*3/uL (ref 0.1–1.0)
Monocytes Relative: 8 %
NEUTROS ABS: 4.4 10*3/uL (ref 1.7–7.7)
NEUTROS PCT: 60 %
PLATELETS: 404 10*3/uL — AB (ref 150–400)
RBC: 4.99 MIL/uL (ref 3.87–5.11)
RDW: 14.3 % (ref 11.5–15.5)
WBC: 7.4 10*3/uL (ref 4.0–10.5)

## 2016-12-29 LAB — PROTIME-INR
INR: 0.95
PROTHROMBIN TIME: 12.7 s (ref 11.4–15.2)

## 2016-12-29 LAB — GLUCOSE, CAPILLARY: Glucose-Capillary: 211 mg/dL — ABNORMAL HIGH (ref 65–99)

## 2016-12-29 MED ORDER — FENTANYL CITRATE (PF) 100 MCG/2ML IJ SOLN
INTRAMUSCULAR | Status: AC | PRN
  Administered 2016-12-29: 50 ug via INTRAVENOUS
  Administered 2016-12-29: 25 ug via INTRAVENOUS

## 2016-12-29 MED ORDER — FLUMAZENIL 0.5 MG/5ML IV SOLN
INTRAVENOUS | Status: AC
  Filled 2016-12-29: qty 5

## 2016-12-29 MED ORDER — NALOXONE HCL 0.4 MG/ML IJ SOLN
INTRAMUSCULAR | Status: AC
  Filled 2016-12-29: qty 1

## 2016-12-29 MED ORDER — FENTANYL CITRATE (PF) 100 MCG/2ML IJ SOLN
INTRAMUSCULAR | Status: AC
  Filled 2016-12-29: qty 6

## 2016-12-29 MED ORDER — SODIUM CHLORIDE 0.9 % IV SOLN
INTRAVENOUS | Status: DC
  Administered 2016-12-29: 13:00:00 via INTRAVENOUS

## 2016-12-29 MED ORDER — MIDAZOLAM HCL 2 MG/2ML IJ SOLN
INTRAMUSCULAR | Status: DC
Start: 2016-12-29 — End: 2016-12-29
  Filled 2016-12-29: qty 4

## 2016-12-29 MED ORDER — MIDAZOLAM HCL 2 MG/2ML IJ SOLN
INTRAMUSCULAR | Status: AC
  Filled 2016-12-29: qty 6

## 2016-12-29 MED ORDER — MIDAZOLAM HCL 2 MG/2ML IJ SOLN
INTRAMUSCULAR | Status: AC | PRN
  Administered 2016-12-29: 0.5 mg via INTRAVENOUS
  Administered 2016-12-29: 1 mg via INTRAVENOUS
  Administered 2016-12-29: 0.5 mg via INTRAVENOUS

## 2016-12-29 NOTE — Procedures (Signed)
Metastatic liver lesions, unknown primary  s/p left liver mass 18 g core bx  No comp Stable Full report in PACS PATH PENDING

## 2016-12-29 NOTE — Consult Note (Signed)
Chief Complaint: Patient was seen in consultation today for image guided liver lesion biopsy  Referring Physician(s): Rossi,Emma  Supervising Physician: Daryll Brod  Patient Status: [facility] - Out-pt  History of Present Illness: Hayley Collins is a 46 y.o. female with a large uterus (possibly fibroids) and a solid appearing right adnexal mass (either pedunculated fibroid vs ovarian mass) in the setting of multiple hepatic masses that are concerning for metastatic disease. She has no prior history of malignancy. She presents today for image guided liver lesion biopsy for further evaluation.   Past Medical History:  Diagnosis Date  . Diabetes mellitus   . Fatty liver   . GERD (gastroesophageal reflux disease)   . Hypertension     History reviewed. No pertinent surgical history.  Allergies: Patient has no known allergies.  Medications: Prior to Admission medications   Medication Sig Start Date End Date Taking? Authorizing Provider  albuterol (PROVENTIL HFA;VENTOLIN HFA) 108 (90 BASE) MCG/ACT inhaler Inhale 2 puffs into the lungs every 6 (six) hours as needed for wheezing or shortness of breath.   Yes Historical Provider, MD  aspirin 325 MG tablet Take 325-650 mg by mouth daily as needed for headache (high blood pressure).    Yes Historical Provider, MD  beclomethasone (QVAR) 80 MCG/ACT inhaler Inhale 2 puffs into the lungs 2 (two) times daily as needed (asthma).    Yes Historical Provider, MD  cetirizine (ZYRTEC) 10 MG tablet take 1 tablet by mouth once daily if needed 09/26/16  Yes Historical Provider, MD  ibuprofen (ADVIL,MOTRIN) 800 MG tablet Take 800 mg by mouth every 8 (eight) hours as needed.   Yes Historical Provider, MD  tiZANidine (ZANAFLEX) 4 MG tablet Take 4 mg by mouth every 6 (six) hours as needed for muscle spasms.   Yes Historical Provider, MD  gabapentin (NEURONTIN) 300 MG capsule take 2 capsules by mouth three times a day 09/26/16   Historical Provider, MD    glimepiride (AMARYL) 4 MG tablet Take 4 mg by mouth daily. 12/06/16   Historical Provider, MD  losartan (COZAAR) 50 MG tablet Take 50 mg by mouth daily.    Historical Provider, MD  metFORMIN (GLUCOPHAGE) 1000 MG tablet Take 1,000 mg by mouth daily with breakfast.     Historical Provider, MD  traMADol (ULTRAM) 50 MG tablet Take 1 tablet (50 mg total) by mouth every 6 (six) hours as needed. Patient not taking: Reported on 12/26/2016 12/20/16   Domenic Moras, PA-C  traMADol-acetaminophen (ULTRACET) 37.5-325 MG per tablet Take 1 tablet by mouth every 6 (six) hours as needed. Patient not taking: Reported on 12/19/2016 03/26/14   Larene Pickett, PA-C     Family History  Problem Relation Age of Onset  . Hypertension Other   . Diabetes Other     Social History   Social History  . Marital status: Married    Spouse name: N/A  . Number of children: N/A  . Years of education: N/A   Social History Main Topics  . Smoking status: Never Smoker  . Smokeless tobacco: Never Used  . Alcohol use No  . Drug use: No  . Sexual activity: Not Asked   Other Topics Concern  . None   Social History Narrative  . None      Review of Systems denies fever,HA,CP, cough, abd pain, back pain,N/V or bleeding. She does have some dyspnea and is very anxious.  Vital Signs: BP 153/81  HR 92  R 18  O2 SATS 98%  RA LMP 12/19/2016   Physical Exam awake/alert; chest- CTA bilat; heart- RRR; abd- obese, soft,+BS,NT; ext- no edema  Mallampati Score:     Imaging: Dg Chest 2 View  Result Date: 12/19/2016 CLINICAL DATA:  Sternal chest pain with weakness EXAM: CHEST  2 VIEW COMPARISON:  05/16/2014 FINDINGS: The heart size and mediastinal contours are within normal limits. Both lungs are clear. The visualized skeletal structures demonstrate degenerative changes. IMPRESSION: No active cardiopulmonary disease. Electronically Signed   By: Donavan Foil M.D.   On: 12/19/2016 21:27   Ct Abdomen Pelvis W Contrast  Result Date:  12/19/2016 CLINICAL DATA:  Abdomen pain and dizziness EXAM: CT ABDOMEN AND PELVIS WITH CONTRAST TECHNIQUE: Multidetector CT imaging of the abdomen and pelvis was performed using the standard protocol following bolus administration of intravenous contrast. CONTRAST:  142mL ISOVUE-300 IOPAMIDOL (ISOVUE-300) INJECTION 61% COMPARISON:  Ultrasound 12/19/2016 FINDINGS: Lower chest: Lung bases demonstrate no acute consolidation or pleural effusion. The heart is nonenlarged. Hepatobiliary: Hepatic steatosis. Multiple peripherally enhancing masses are present throughout the liver, measuring up to 3.3 cm. No biliary dilatation. No calcified gallstones. Pancreas: Unremarkable. No pancreatic ductal dilatation or surrounding inflammatory changes. Spleen: Normal in size without focal abnormality. Adrenals/Urinary Tract: Adrenal glands are unremarkable. Kidneys are normal, without renal calculi, focal lesion, or hydronephrosis. Bladder is unremarkable. Stomach/Bowel: Stomach is within normal limits. Appendix appears normal. No evidence of bowel wall thickening, distention, or inflammatory changes. Vascular/Lymphatic: 8 mm soft tissue nodule or lymph node in the lower pelvis. Small pelvic sidewall lymph nodes, subcentimeter Reproductive: The uterus is markedly enlarged and extend superiorly to the just above the umbilicus. Multiple enhancing masses are present within the uterus. Oblong hypodensity in the vagina with air likely represents a tampon. There is a right pelvic heterogenously enhancing mass measuring 8.6 by 6.6 cm, uncertain if this is exophytic from the enlarged uterus or if this represents a right adnexal mass. Other: Small free fluid in the right at adnexa.  No free air. Musculoskeletal: Degenerative changes of the spine. No acute or suspicious bone lesions. IMPRESSION: 1. Hepatic steatosis with multiple masses, corresponding to the ultrasound abnormality. Findings are concerning for metastatic disease. 2. Massively  enlarged uterus which extends to the level of the umbilicus. Multiple enhancing masses within the uterus could represent fibroids. Heterogenous hyperenhancing mass in the right pelvis measuring up to 8.6 cm, uncertain if this is exophytic from the uterus or represents a right adnexal mass. Surgical consultation is suggested. 3. Small amount of free fluid in the right adnexa Electronically Signed   By: Donavan Foil M.D.   On: 12/19/2016 23:09   US Abdomen Limited Ruq  Result Date: 12/19/2016 CLINICAL DATA:  Right upper quadrant pain for 2 days. EXAM: US ABDOMEN LIMITED - RIGHT UPPER QUADRANT COMPARISON:  None. FINDINGS: Gallbladder: No gallstones or wall thickening visualized. No sonographic Murphy sign noted by sonographer. Common bile duct: Diameter: 2.5 mm Liver: Multiple solid masses are present in the liver, new from prior studies of 2013 and 2014. These measure up to 3.7 cm. IMPRESSION: 1. New solid masses in the liver, suspicious. Recommend CT or MRI with contrast. 2. Normal gallbladder and bile ducts. 3. These results will be called to the ordering clinician or representative by the Radiologist Assistant, and communication documented in the PACS or zVision Dashboard. Electronically Signed   By: Andreas Newport M.D.   On: 12/19/2016 21:45    Labs:  CBC:  Recent Labs  12/19/16 1736  WBC 10.0  HGB 12.3  HCT 37.7  PLT 230    COAGS:  Recent Labs  12/29/16 1242  INR 0.95    BMP:  Recent Labs  12/19/16 1736  NA 132*  K 3.7  CL 99*  CO2 23  GLUCOSE 254*  BUN 11  CALCIUM 9.5  CREATININE 0.68  GFRNONAA >60  GFRAA >60    LIVER FUNCTION TESTS:  Recent Labs  12/19/16 1736  BILITOT 0.8  AST 29  ALT 29  ALKPHOS 138*  PROT 8.3*  ALBUMIN 3.7    TUMOR MARKERS: No results for input(s): AFPTM, CEA, CA199, CHROMGRNA in the last 8760 hours.  Assessment and Plan: 46 y.o. female with a large uterus (possibly fibroids) and a solid appearing right adnexal mass (either  pedunculated fibroid vs ovarian mass) in the setting of multiple hepatic masses that are concerning for metastatic disease. She has no prior history of malignancy. She presents today for image guided liver lesion biopsy for further evaluation.Risks and benefits discussed with the patient/spouse including, but not limited to bleeding, infection, damage to adjacent structures or low yield requiring additional tests.All of the patient's questions were answered, patient is agreeable to proceed.Consent signed and in chart.     Thank you for this interesting consult.  I greatly enjoyed meeting CONSANDRA LASKE and look forward to participating in their care.  A copy of this report was sent to the requesting provider on this date.  Electronically Signed: D. Rowe Robert 12/29/2016, 1:13 PM   I spent a total of 25 minutes  in face to face in clinical consultation, greater than 50% of which was counseling/coordinating care for image guided liver lesion biopsy

## 2016-12-29 NOTE — Discharge Instructions (Signed)
Liver Biopsy, Care After °These instructions give you information on caring for yourself after your procedure. Your doctor may also give you more specific instructions. Call your doctor if you have any problems or questions after your procedure. °Follow these instructions at home: °· Rest at home for 1-2 days or as told by your doctor. °· Have someone stay with you for at least 24 hours. °· Do not do these things in the first 24 hours: °¨ Drive. °¨ Use machinery. °¨ Take care of other people. °¨ Sign legal documents. °¨ Take a bath or shower. °· There are many different ways to close and cover a cut (incision). For example, a cut can be closed with stitches, skin glue, or adhesive strips. Follow your doctor's instructions on: °¨ Taking care of your cut. °¨ Changing and removing your bandage (dressing). °¨ Removing whatever was used to close your cut. °· Do not drink alcohol in the first week. °· Do not lift more than 5 pounds or play contact sports for the first 2 weeks. °· Take medicines only as told by your doctor. For 1 week, do not take medicine that has aspirin in it or medicines like ibuprofen. °· Get your test results. °Contact a doctor if: °· A cut bleeds and leaves more than just a small spot of blood. °· A cut is red, puffs up (swells), or hurts more than before. °· Fluid or something else comes from a cut. °· A cut smells bad. °· You have a fever or chills. °Get help right away if: °· You have swelling, bloating, or pain in your belly (abdomen). °· You get dizzy or faint. °· You have a rash. °· You feel sick to your stomach (nauseous) or throw up (vomit). °· You have trouble breathing, feel short of breath, or feel faint. °· Your chest hurts. °· You have problems talking or seeing. °· You have trouble balancing or moving your arms or legs. °This information is not intended to replace advice given to you by your health care provider. Make sure you discuss any questions you have with your health care  provider. °Document Released: 07/11/2008 Document Revised: 03/09/2016 Document Reviewed: 11/28/2013 °Elsevier Interactive Patient Education © 2017 [employer] °Moderate Conscious Sedation, Adult, Care After °These instructions provide you with information about caring for yourself after your procedure. Your health care provider may also give you more specific instructions. Your treatment has been planned according to current medical practices, but problems sometimes occur. Call your health care provider if you have any problems or questions after your procedure. °What can I expect after the procedure? °After your procedure, it is common: °· To feel sleepy for several hours. °· To feel clumsy and have poor balance for several hours. °· To have poor judgment for several hours. °· To vomit if you eat too soon. °Follow these instructions at home: °For at least 24 hours after the procedure:  ° °· Do not: °¨ Participate in activities where you could fall or become injured. °¨ Drive. °¨ Use heavy machinery. °¨ Drink alcohol. °¨ Take sleeping pills or medicines that cause drowsiness. °¨ Make important decisions or sign legal documents. °¨ Take care of children on your own. °· Rest. °Eating and drinking  °· Follow the diet recommended by your health care provider. °· If you vomit: °¨ Drink water, juice, or soup when you can drink without vomiting. °¨ Make sure you have little or no nausea before eating solid foods. °General instructions  °· Have   a responsible adult stay with you until you are awake and alert. °· Take over-the-counter and prescription medicines only as told by your health care provider. °· If you smoke, do not smoke without supervision. °· Keep all follow-up visits as told by your health care provider. This is important. °Contact a health care provider if: °· You keep feeling nauseous or you keep vomiting. °· You feel light-headed. °· You develop a rash. °· You have a fever. °Get help right away if: °· You  have trouble breathing. °This information is not intended to replace advice given to you by your health care provider. Make sure you discuss any questions you have with your health care provider. °Document Released: 07/23/2013 Document Revised: 03/06/2016 Document Reviewed: 01/22/2016 °Elsevier Interactive Patient Education © 2017 [employer] ° °

## 2017-01-04 ENCOUNTER — Telehealth: Payer: Self-pay | Admitting: *Deleted

## 2017-01-04 NOTE — Telephone Encounter (Signed)
Patient called and left a message regarding the results from her liver biopsy I have called her back and told her the results were not ready, that once results were done we would call her.

## 2017-01-16 ENCOUNTER — Telehealth: Payer: Self-pay | Admitting: Gynecologic Oncology

## 2017-01-16 DIAGNOSIS — C579 Malignant neoplasm of female genital organ, unspecified: Secondary | ICD-10-CM

## 2017-01-16 DIAGNOSIS — C787 Secondary malignant neoplasm of liver and intrahepatic bile duct: Secondary | ICD-10-CM | POA: Insufficient documentation

## 2017-01-16 DIAGNOSIS — C569 Malignant neoplasm of unspecified ovary: Secondary | ICD-10-CM | POA: Insufficient documentation

## 2017-01-16 DIAGNOSIS — C561 Malignant neoplasm of right ovary: Secondary | ICD-10-CM

## 2017-01-16 NOTE — Telephone Encounter (Signed)
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  We discussed that this represents a stage IV gynecologic (likely) cancer.   I discussed that, given that she has no symptoms from the ovarian mass or bulky uterus, and that her disease would not be able to be completely resected (or optimally debulked) at this initial stage (due to large intraparenchymal liver lesions), I am recommending neoadjuvant chemotherapy with carboplatin and paclitaxel. I would then recommend follow-up CT imaging after 3 cycles and a plan to consider interval hysterectomy and BSO/debulking at that time if she has had a good response to the distant metastatic disease.  She also requires a chest CT to better characterize the lung fields. I will order this.  I will schedule her to see my medical oncology colleague, Dr Heath Lark.  The patient says she "needs time to think about things" and is reluctant for therapy at this time. I explained that without treatment the cancer would continue to spread and would become lethal.

## 2017-01-17 ENCOUNTER — Telehealth: Payer: Self-pay | Admitting: *Deleted

## 2017-01-17 NOTE — Telephone Encounter (Signed)
Patient requested copy of her biospy requests. Copy ready for the patient to pick up

## 2017-01-17 NOTE — Telephone Encounter (Signed)
I attempted to call the patient to make appt with Dr. Alvy Bimler. Patient stated that " I wish to wait and think over her results before making any other appts. I will call  Her/office when she makes her decision."

## 2017-01-17 NOTE — Telephone Encounter (Signed)
Per the conversation with the patient I offered the appt with Dr. Alvy Bimler to help her make her decision, pt declined.

## 2017-01-26 ENCOUNTER — Telehealth: Payer: Self-pay | Admitting: *Deleted

## 2017-01-26 NOTE — Telephone Encounter (Signed)
Patient called wanting to speak to [facility]. She received voicemail.

## 2017-01-26 NOTE — Telephone Encounter (Signed)
I have called and left a message for the patient to call the office regarding her scan.

## 2017-01-26 NOTE — Telephone Encounter (Signed)
I have called central scheduling and set up the CT scan for the patient

## 2017-01-26 NOTE — Telephone Encounter (Signed)
Patient called back and was given information for her CT scan. I have given her the date/time and to be npo 4 hrs prior to the test. Scan for April 19th at [facility]

## 2017-01-31 ENCOUNTER — Ambulatory Visit: Payer: Medicaid Other | Attending: Gynecologic Oncology | Admitting: Gynecologic Oncology

## 2017-01-31 ENCOUNTER — Encounter: Payer: Self-pay | Admitting: Gynecologic Oncology

## 2017-01-31 VITALS — BP 157/87 | HR 80 | Temp 97.8°F | Resp 18 | Wt 197.7 lb

## 2017-01-31 DIAGNOSIS — Z7982 Long term (current) use of aspirin: Secondary | ICD-10-CM | POA: Insufficient documentation

## 2017-01-31 DIAGNOSIS — Z79899 Other long term (current) drug therapy: Secondary | ICD-10-CM | POA: Insufficient documentation

## 2017-01-31 DIAGNOSIS — I10 Essential (primary) hypertension: Secondary | ICD-10-CM | POA: Insufficient documentation

## 2017-01-31 DIAGNOSIS — R200 Anesthesia of skin: Secondary | ICD-10-CM | POA: Insufficient documentation

## 2017-01-31 DIAGNOSIS — Z8249 Family history of ischemic heart disease and other diseases of the circulatory system: Secondary | ICD-10-CM | POA: Diagnosis not present

## 2017-01-31 DIAGNOSIS — C787 Secondary malignant neoplasm of liver and intrahepatic bile duct: Secondary | ICD-10-CM | POA: Diagnosis present

## 2017-01-31 DIAGNOSIS — E1140 Type 2 diabetes mellitus with diabetic neuropathy, unspecified: Secondary | ICD-10-CM | POA: Diagnosis not present

## 2017-01-31 DIAGNOSIS — K219 Gastro-esophageal reflux disease without esophagitis: Secondary | ICD-10-CM | POA: Diagnosis not present

## 2017-01-31 DIAGNOSIS — E11319 Type 2 diabetes mellitus with unspecified diabetic retinopathy without macular edema: Secondary | ICD-10-CM | POA: Diagnosis not present

## 2017-01-31 DIAGNOSIS — E669 Obesity, unspecified: Secondary | ICD-10-CM | POA: Insufficient documentation

## 2017-01-31 DIAGNOSIS — R42 Dizziness and giddiness: Secondary | ICD-10-CM | POA: Insufficient documentation

## 2017-01-31 DIAGNOSIS — Z7984 Long term (current) use of oral hypoglycemic drugs: Secondary | ICD-10-CM | POA: Insufficient documentation

## 2017-01-31 DIAGNOSIS — M549 Dorsalgia, unspecified: Secondary | ICD-10-CM | POA: Insufficient documentation

## 2017-01-31 DIAGNOSIS — N852 Hypertrophy of uterus: Secondary | ICD-10-CM | POA: Diagnosis not present

## 2017-01-31 DIAGNOSIS — R1900 Intra-abdominal and pelvic swelling, mass and lump, unspecified site: Secondary | ICD-10-CM | POA: Diagnosis not present

## 2017-01-31 DIAGNOSIS — R079 Chest pain, unspecified: Secondary | ICD-10-CM | POA: Diagnosis not present

## 2017-01-31 DIAGNOSIS — Z833 Family history of diabetes mellitus: Secondary | ICD-10-CM | POA: Diagnosis not present

## 2017-01-31 DIAGNOSIS — C579 Malignant neoplasm of female genital organ, unspecified: Secondary | ICD-10-CM | POA: Diagnosis not present

## 2017-01-31 NOTE — Patient Instructions (Signed)
Please follow-up with Dr [name] for your ongoing care.  If you elect to have chemotherapy for your stage four ovarian cancer, please call our office at [phone] and we will facilitate referral to Dr Alvy Bimler.  If you do not elect to have further treatment for your cancer you will not require future follow-up with our office.

## 2017-01-31 NOTE — Progress Notes (Signed)
Consult Note: Gyn-Onc  Consult was requested by Dr. Penny Pia for the evaluation of Hayley Collins 46 y.o. female  CC:  Chief Complaint  Patient presents with  . metastatic adenocarcinoma to liver    Assessment/Plan:  Ms. Hayley Collins  is a 46 y.o.  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  This would represent stage IV gynecologic malignancy (I favor ovarian origin). Chest CT pending.  I spent 30 minutes with [name] and her husband explaining the results of her testing. 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 I explained that this is very advanced disease and a result of spread of the cancer through the blood stream. I explained that this is a universally lethal condition, likely within the next 6 months, if untreated. I explained that even with treatment, chance of cure is approximately 30%, however, in many ovarian cancers remission can occur in 80% of patients (which means temporary resolution of visibile tumors). I explained that because her tumor had extensively spread through the blood stream to other organs which are unresectable, she is not a candidate for surgical therapy as this will not improve survival or likelihood of cure. I also explained that because she is having no symptoms, a palliative surgery is not appropriate.  Gypsy explained to me that she does not want chemotherapy because she doesn't "want to lose my hair" and "I don't think that taking something that is harmful to my body is good for me". She believes that with prayer the cancer may shrink on its own.   I explained to [name] that cancer does not shrink or resolve spontaneously, and that the natural progression of cancer is to progress. I explained that without chemotherapy there is a certain (154%) chance  for death from cancer. I explained that the opportunity for remission or cure will disappear if she does not attempt therapy now. I explained that if she waits for the cancer to further progress and her performance status to deteriorate, she will no longer be a candidate for a trial of chemotherapy, even if she changes her mind at that time and elects for treatment.  I discussed that if she is electing for no treatment of her cancer at this time, then we should put in place Hospice care so that as her symptoms and cancer progress, aggressive symptom management can be available to [facility] which would reduce suffering and improve quality of life without causing any compromise in life expectancy. I explained to [name] that without Hospice in place, she will have limited options for palliation, and might require hospitalization later as her cancer progresses, however this would be a futile and unpleasant event given that there would be no appropriate oncologic intervention available. I explained that if she is electing for no further treatment for her cancer, she should consider her code status. I recommended that she consider and implement a DNR status because in the setting of advanced untreatable cancer, cardiopulmonary resuscitation would be futile and inappropriate, and would not result in preserving her life. However, [name] declines this at this time because she stated that she would "want everything done to save my life". I explained to [facility], that receiving chemotherapy for her cancer is the only intervention that can save her life (rather than intubation and cardiopulmonary resuscitation), however, if she  is declining this, no other therapy outside of palliative care would be appropriate.  At the end of the visit Nili is continuing to decline consultation with medical oncology or hospice. She declines DNR status.   As I have nothing further to offer [name], I am recommending that she receive future care for  her advanced cancer with her primary care doctor, Dr [name].   If she desires consultation with me or medical oncology in the future, she is welcome to reach out. I provided her with contact information.  HPI: Hayley Collins is a 46 year old [id number] who is seen in consultation at the request of Dr Penny Pia for an enlarged pelvic mass and right adnexal mass and hepatic lesions.  The patient was seen in the ED on 12/19/16 when she was feeling "dizzy". She also reported upper back pains (thoracic). As part of her general workup she received a CT abdo/pelvis which showed the uterus is markedly enlarged and extend superiorly to the just above the umbilicus. Multiple enhancing masses are present within the uterus. There is a right pelvic heterogenously enhancing mass measuring 5.2DP 6.6 cm, uncertain if this is exophytic from the enlarged uterus or if this represents a right adnexal mass. Hepatic steatosis. Multiple peripherally enhancing masses are present throughout the liver, measuring up to 3.3 cm.  She denies abnormal uterine bleeding. She has regular cyclic periods. She has become "menopausal" with spacing of her menses. She denies abdominal pain or bloating or early satiety. She denies change in bladder or bowel habit.  The patient has never had a colonoscopy.  The patient has a poor general health story with obesity and type II DM for which she takes Metformin. She reports that she has been prescribed other medications in the past but declined taking them. Her blood glucose in the ER in March, 2018 was 235 which was similar to a value drawn in 2014.  She admits to poor diet control.  She has known diabetic neuropathy and retinopathy.   She also has substantial back pain, degeneration and chronic sciatica in the right with numbness in the right thigh.    She has not had abdominal surgery before.  Interval Hx:  Tumor markers were drawn on 12/26/16 and revelaed CA 125: 48 CEA: <1  Ultrasound  guided biopsy of the liver on 12/29/16 showed: The liver biopsy demonstrates benign liver tissue with embedded malignant tumor consistent with adenocarcinoma. Immunohistochemical stains are performed. The tumor is positive for cytokeratin AE1/AE3, MOC-31, p16 and PAX-8. It demonstrates patchy CDX-2 positivity and some degree of patchy cytokeratin 903 positivity. Estrogen receptor, WT-1, cytokeratin 20, cytokeratin 7, arginase, glypican-3 and TTF-1 are all negative. A mucicarmine stain also is negative. The staining pattern favors a primary gynecologic origin (with aberrant CDX-2 positivity). Please correlate with clinical and radiologic impression.  She was recommended a referral to medical oncology to begin treatment with carboplatin and paclitaxel but declined this appointment as she wasn't ready for treatment.    Current Meds:  Outpatient Encounter Prescriptions as of 01/31/2017  Medication Sig  . albuterol (PROVENTIL HFA;VENTOLIN HFA) 108 (90 BASE) MCG/ACT inhaler Inhale 2 puffs into the lungs every 6 (six) hours as needed for wheezing or shortness of breath.  Marland Kitchen aspirin 325 MG tablet Take 325-650 mg by mouth daily as needed for headache (high blood pressure).   . beclomethasone (QVAR) 80 MCG/ACT inhaler Inhale 2 puffs into the lungs 2 (two) times daily as needed (asthma).   . cetirizine (ZYRTEC) 10 MG tablet  take 1 tablet by mouth once daily if needed  . gabapentin (NEURONTIN) 300 MG capsule take 2 capsules by mouth three times a day  . glimepiride (AMARYL) 4 MG tablet Take 4 mg by mouth daily.  Marland Kitchen ibuprofen (ADVIL,MOTRIN) 800 MG tablet Take 800 mg by mouth every 8 (eight) hours as needed.  Marland Kitchen losartan (COZAAR) 50 MG tablet Take 50 mg by mouth daily.  . metFORMIN (GLUCOPHAGE) 1000 MG tablet Take 1,000 mg by mouth daily with breakfast.   . tiZANidine (ZANAFLEX) 4 MG tablet Take 4 mg by mouth every 6 (six) hours as needed for muscle spasms.  . traMADol (ULTRAM) 50 MG tablet Take 1 tablet (50 mg  total) by mouth every 6 (six) hours as needed.  . traMADol-acetaminophen (ULTRACET) 37.5-325 MG per tablet Take 1 tablet by mouth every 6 (six) hours as needed.   No facility-administered encounter medications on file as of 01/31/2017.     Allergy: No Known Allergies  Social Hx:   Social History   Social History  . Marital status: Married    Spouse name: N/A  . Number of children: N/A  . Years of education: N/A   Occupational History  . Not on file.   Social History Main Topics  . Smoking status: Never Smoker  . Smokeless tobacco: Never Used  . Alcohol use No  . Drug use: No  . Sexual activity: Not on file   Other Topics Concern  . Not on file   Social History Narrative  . No narrative on file    Past Surgical Hx: History reviewed. No pertinent surgical history.  Past Medical Hx:  Past Medical History:  Diagnosis Date  . Diabetes mellitus   . Fatty liver   . GERD (gastroesophageal reflux disease)   . Hypertension     Past Gynecological History:  SVD x 1 No LMP recorded.  Family Hx:  Family History  Problem Relation Age of Onset  . Hypertension Other   . Diabetes Other     Review of Systems:  Constitutional  Feels intermitently dizzy   ENT Normal appearing ears and nares bilaterally Skin/Breast  No rash, sores, jaundice, itching, dryness Cardiovascular  No chest pain, shortness of breath, or edema  Pulmonary  No cough or wheeze.  Gastro Intestinal  No nausea, vomitting, or diarrhoea. No bright red blood per rectum, no abdominal pain, change in bowel movement, or constipation.  Genito Urinary  No frequency, urgency, dysuria, no abnormal menses. No pain or gyn symptoms at all. Musculo Skeletal  + chronic back pain Neurologic  + bilateral foot neuropathy and right thigh and leg numbness and sciatica Psychology  No depression, anxiety, insomnia.   Vitals:  Blood pressure (!) 157/87, pulse 80, temperature 97.8 F (36.6 C), resp. rate 18, weight  197 lb 11.2 oz (89.7 kg).  Physical Exam: In no apparent distress.  30 minutes in face to face time was spent with the patient counseling her about diagnosis, prognosis, treatment options, end of life planning and DNR status.  Donaciano Eva, MD  01/31/2017, 9:53 AM

## 2017-02-01 ENCOUNTER — Ambulatory Visit (HOSPITAL_COMMUNITY)
Admission: RE | Admit: 2017-02-01 | Discharge: 2017-02-01 | Disposition: A | Discharge status: Home / Self Care | Payer: Medicaid Other | Source: Ambulatory Visit | Attending: Gynecologic Oncology | Admitting: Gynecologic Oncology

## 2017-02-01 DIAGNOSIS — C561 Malignant neoplasm of right ovary: Secondary | ICD-10-CM | POA: Diagnosis present

## 2017-02-01 MED ORDER — IOPAMIDOL (ISOVUE-300) INJECTION 61%
INTRAVENOUS | Status: AC
  Administered 2017-02-01: 75 mL
  Filled 2017-02-01: qty 75

## 2017-02-12 ENCOUNTER — Telehealth: Payer: Self-pay

## 2017-02-12 NOTE — Telephone Encounter (Signed)
-----   Message from Everitt Amber, MD sent at 02/05/2017 11:29 AM EDT ----- Dear Barbaraann Share, Would you mind letting Ms Arens know that her CT chest showed no evidence of spread of the cancer beyond the liver into the lungs. This does not change the stage (4) of her cancer or our recommendation for chemotherapy. Thanks Terrence Dupont

## 2017-02-12 NOTE — Telephone Encounter (Signed)
Told Hayley Collins the results of the CT scan of the chest as noted below by Dr. Denman George. Pt verbalized understanding.

## 2017-03-16 ENCOUNTER — Emergency Department (HOSPITAL_COMMUNITY)
Admission: EM | Admit: 2017-03-16 | Discharge: 2017-03-16 | Disposition: A | Discharge status: Home / Self Care | Payer: Medicaid Other | Attending: Emergency Medicine | Admitting: Emergency Medicine

## 2017-03-16 ENCOUNTER — Encounter (HOSPITAL_COMMUNITY): Payer: Self-pay

## 2017-03-16 ENCOUNTER — Emergency Department (HOSPITAL_COMMUNITY): Payer: Medicaid Other

## 2017-03-16 DIAGNOSIS — E119 Type 2 diabetes mellitus without complications: Secondary | ICD-10-CM | POA: Diagnosis not present

## 2017-03-16 DIAGNOSIS — Z79899 Other long term (current) drug therapy: Secondary | ICD-10-CM | POA: Insufficient documentation

## 2017-03-16 DIAGNOSIS — I10 Essential (primary) hypertension: Secondary | ICD-10-CM | POA: Insufficient documentation

## 2017-03-16 DIAGNOSIS — Z794 Long term (current) use of insulin: Secondary | ICD-10-CM | POA: Diagnosis not present

## 2017-03-16 DIAGNOSIS — R05 Cough: Secondary | ICD-10-CM

## 2017-03-16 DIAGNOSIS — J069 Acute upper respiratory infection, unspecified: Secondary | ICD-10-CM | POA: Insufficient documentation

## 2017-03-16 DIAGNOSIS — R059 Cough, unspecified: Secondary | ICD-10-CM

## 2017-03-16 MED ORDER — ALBUTEROL SULFATE HFA 108 (90 BASE) MCG/ACT IN AERS: 1.0000 | INHALATION_SPRAY | Freq: Four times a day (QID) | RESPIRATORY_TRACT | 0 refills | Status: AC | PRN

## 2017-03-16 MED ORDER — PROMETHAZINE-DM 6.25-15 MG/5ML PO SYRP: 5.0000 mL | ORAL_SOLUTION | Freq: Four times a day (QID) | ORAL | 0 refills | Status: DC | PRN

## 2017-03-16 MED ORDER — ACETAMINOPHEN 325 MG PO TABS
650.0000 mg | ORAL_TABLET | Freq: Once | ORAL | Status: AC
  Administered 2017-03-16: 650 mg via ORAL
  Filled 2017-03-16: qty 2

## 2017-03-16 MED ORDER — PREDNISONE 20 MG PO TABS: 20.0000 mg | ORAL_TABLET | Freq: Two times a day (BID) | ORAL | 0 refills | Status: DC

## 2017-03-16 MED ORDER — PREDNISONE 20 MG PO TABS
60.0000 mg | ORAL_TABLET | Freq: Once | ORAL | Status: AC
  Administered 2017-03-16: 60 mg via ORAL
  Filled 2017-03-16: qty 3

## 2017-03-16 MED ORDER — ALBUTEROL SULFATE (2.5 MG/3ML) 0.083% IN NEBU
2.5000 mg | INHALATION_SOLUTION | RESPIRATORY_TRACT | Status: DC | PRN
  Administered 2017-03-16: 2.5 mg via RESPIRATORY_TRACT
  Filled 2017-03-16: qty 3

## 2017-03-16 NOTE — Discharge Instructions (Signed)
Cough medication and inhaler as prescribed.

## 2017-03-16 NOTE — ED Provider Notes (Signed)
[facility] DEPT Provider Note   CSN: [id number] Arrival date & time: 03/16/17  1630     History   Chief Complaint Chief Complaint  Patient presents with  . Cough  . Fever    HPI Hayley Collins is a 46 y.o. female. CC: cough  HPI:  Patient describes 4 days of cough. Body aches and fevers at home. States her throat is "raw" she has pain in her chest with coughing. Feels like she can't take a deep breath without coughing and wheezing.  No GI complaints. No hemoptysis. No leg swelling.  Past Medical History:  Diagnosis Date  . Diabetes mellitus   . Fatty liver   . GERD (gastroesophageal reflux disease)   . Hypertension     Patient Active Problem List   Diagnosis Date Noted  . Secondary malignant neoplasm of liver ([place]) 01/16/2017  . Gynecologic malignancy ([place]) 01/16/2017  . Ovarian mass, right 12/26/2016  . Enlarged uterus 12/26/2016  . Liver masses 12/26/2016  . Diabetes ([place]) 12/26/2016    History reviewed. No pertinent surgical history.  OB History    No data available       Home Medications    Prior to Admission medications   Medication Sig Start Date End Date Taking? Authorizing Provider  beclomethasone (QVAR) 80 MCG/ACT inhaler Inhale 2 puffs into the lungs 2 (two) times daily as needed (asthma).    Yes [provider]  losartan (COZAAR) 50 MG tablet Take 50 mg by mouth daily.   Yes [provider]  metFORMIN (GLUCOPHAGE) 1000 MG tablet Take 1,000 mg by mouth daily with breakfast.    Yes [provider]  albuterol (PROVENTIL HFA;VENTOLIN HFA) 108 (90 Base) MCG/ACT inhaler Inhale 1-2 puffs into the lungs every 6 (six) hours as needed for wheezing. 03/16/17   Tanna Furry, MD  aspirin 325 MG tablet Take 325-650 mg by mouth daily as needed for headache (high blood pressure).     [provider]  cetirizine (ZYRTEC) 10 MG tablet take 1 tablet by mouth once daily if needed for allergies 09/26/16   [provider]    predniSONE (DELTASONE) 20 MG tablet Take 1 tablet (20 mg total) by mouth 2 (two) times daily with a meal. 03/16/17   Tanna Furry, MD  promethazine-dextromethorphan (PROMETHAZINE-DM) 6.25-15 MG/5ML syrup Take 5 mLs by mouth 4 (four) times daily as needed for cough. 03/16/17   Tanna Furry, MD  traMADol (ULTRAM) 50 MG tablet Take 1 tablet (50 mg total) by mouth every 6 (six) hours as needed. Patient taking differently: Take 50 mg by mouth every 6 (six) hours as needed for moderate pain or severe pain.  12/20/16   Domenic Moras, PA-C    Family History Family History  Problem Relation Age of Onset  . Hypertension Other   . Diabetes Other     Social History Social History  Substance Use Topics  . Smoking status: Never Smoker  . Smokeless tobacco: Never Used  . Alcohol use No     Allergies   Patient has no known allergies.   Review of Systems Review of Systems  Constitutional: Positive for fatigue and fever. Negative for appetite change, chills and diaphoresis.  HENT: Negative for mouth sores, sore throat and trouble swallowing.   Eyes: Negative for visual disturbance.  Respiratory: Positive for cough, shortness of breath and wheezing. Negative for chest tightness.   Cardiovascular: Negative for chest pain.  Gastrointestinal: Negative for abdominal distention, abdominal pain, diarrhea, nausea and vomiting.  Endocrine: Negative for polydipsia, polyphagia and polyuria.  Genitourinary: Negative for dysuria, frequency and hematuria.  Musculoskeletal: Positive for myalgias. Negative for gait problem.  Skin: Negative for color change, pallor and rash.  Neurological: Negative for dizziness, syncope, light-headedness and headaches.  Hematological: Does not bruise/bleed easily.  Psychiatric/Behavioral: Negative for behavioral problems and confusion.     Physical Exam Updated Vital Signs BP 132/90 (BP Location: Left Arm)   Pulse (!) 112   Temp 99.8 F (37.7 C) (Oral)   Resp 14   LMP  02/17/2017   SpO2 98%   Physical Exam  Constitutional: She is oriented to person, place, and time. She appears well-developed and well-nourished. No distress.  HENT:  Head: Normocephalic.  Eyes: Conjunctivae are normal. Pupils are equal, round, and reactive to light. No scleral icterus.  Neck: Normal range of motion. Neck supple. No thyromegaly present.  Cardiovascular: Normal rate and regular rhythm.  Exam reveals no gallop and no friction rub.   No murmur heard. Pulmonary/Chest: Effort normal and breath sounds normal. No respiratory distress. She has no wheezes. She has no rales.  Wheezing with cough. No localized rales or prolongation at rest.  Abdominal: Soft. Bowel sounds are normal. She exhibits no distension. There is no tenderness. There is no rebound.  Musculoskeletal: Normal range of motion.  Neurological: She is alert and oriented to person, place, and time.  Skin: Skin is warm and dry. No rash noted.  Psychiatric: She has a normal mood and affect. Her behavior is normal.     ED Treatments / Results  Labs (all labs ordered are listed, but only abnormal results are displayed) Labs Reviewed - No data to display  EKG  EKG Interpretation None       Radiology Dg Chest 2 View  Result Date: 03/16/2017 CLINICAL DATA:  Cough and fever for 1 week. EXAM: CHEST  2 VIEW COMPARISON:  PA and lateral chest 12/19/2016. FINDINGS: Lungs are clear. Heart size is normal. No pneumothorax or pleural effusion. No acute bony abnormality. IMPRESSION: No acute disease. Electronically Signed   By: Inge Rise M.D.   On: 03/16/2017 18:11    Procedures Procedures (including critical care time)  Medications Ordered in ED Medications  albuterol (PROVENTIL) (2.5 MG/3ML) 0.083% nebulizer solution 2.5 mg (2.5 mg Nebulization Given 03/16/17 1751)  acetaminophen (TYLENOL) tablet 650 mg (650 mg Oral Given 03/16/17 1823)  predniSONE (DELTASONE) tablet 60 mg (60 mg Oral Given 03/16/17 1823)      Initial Impression / Assessment and Plan / ED Course  I have reviewed the triage vital signs and the nursing notes.  Pertinent labs & imaging results that were available during my care of the patient were reviewed by me and considered in my medical decision making (see chart for details).    Given prednisone and albuterol. Feels improvement. Less cough. No abnormalities on chest x-ray. No infiltrates or effusion. Temperature initially. Given Tylenol by mouth. Recheck 99 8. Appropriate for discharge. Plan albuterol, promethazine-DM syrup, prednisone.  Follow-up.  Final Clinical Impressions(s) / ED Diagnoses   Final diagnoses:  Viral upper respiratory tract infection  Cough    New Prescriptions New Prescriptions   ALBUTEROL (PROVENTIL HFA;VENTOLIN HFA) 108 (90 BASE) MCG/ACT INHALER    Inhale 1-2 puffs into the lungs every 6 (six) hours as needed for wheezing.   PREDNISONE (DELTASONE) 20 MG TABLET    Take 1 tablet (20 mg total) by mouth 2 (two) times daily with a meal.   PROMETHAZINE-DEXTROMETHORPHAN (PROMETHAZINE-DM) 6.25-15 MG/5ML  SYRUP    Take 5 mLs by mouth 4 (four) times daily as needed for cough.     Tanna Furry, MD 03/16/17 [phone]

## 2017-03-16 NOTE — ED Triage Notes (Addendum)
Pt c/o cough and fever x1 week. She has tried no medication at home. She is also complaining of lethargy and lungs burning when she coughs. A&Ox4. Ambulatory.

## 2017-03-19 ENCOUNTER — Encounter (HOSPITAL_COMMUNITY): Payer: Self-pay | Admitting: Emergency Medicine

## 2017-03-19 ENCOUNTER — Emergency Department (HOSPITAL_COMMUNITY)
Admission: EM | Admit: 2017-03-19 | Discharge: 2017-03-19 | Disposition: A | Discharge status: Home / Self Care | Payer: Medicaid Other | Attending: Emergency Medicine | Admitting: Emergency Medicine

## 2017-03-19 ENCOUNTER — Emergency Department (HOSPITAL_COMMUNITY): Payer: Medicaid Other

## 2017-03-19 DIAGNOSIS — E119 Type 2 diabetes mellitus without complications: Secondary | ICD-10-CM | POA: Diagnosis not present

## 2017-03-19 DIAGNOSIS — Z79899 Other long term (current) drug therapy: Secondary | ICD-10-CM | POA: Diagnosis not present

## 2017-03-19 DIAGNOSIS — E860 Dehydration: Secondary | ICD-10-CM | POA: Diagnosis not present

## 2017-03-19 DIAGNOSIS — Z8505 Personal history of malignant neoplasm of liver: Secondary | ICD-10-CM | POA: Diagnosis not present

## 2017-03-19 DIAGNOSIS — R51 Headache: Secondary | ICD-10-CM | POA: Insufficient documentation

## 2017-03-19 DIAGNOSIS — R519 Headache, unspecified: Secondary | ICD-10-CM

## 2017-03-19 LAB — URINALYSIS, ROUTINE W REFLEX MICROSCOPIC
Bilirubin Urine: NEGATIVE
Glucose, UA: >=500 mg/dL — AB
Hgb urine dipstick: NEGATIVE
KETONES UR: NEGATIVE mg/dL
Leukocytes, UA: NEGATIVE
Nitrite: NEGATIVE
PH: 5 (ref 5.0–8.0)
Protein, ur: 30 mg/dL — AB
Specific Gravity, Urine: 1.018 (ref 1.005–1.030)

## 2017-03-19 LAB — BASIC METABOLIC PANEL
Anion gap: 11 (ref 5–15)
BUN: 16 mg/dL (ref 6–20)
CALCIUM: 9 mg/dL (ref 8.9–10.3)
CO2: 23 mmol/L (ref 22–32)
CREATININE: 0.64 mg/dL (ref 0.44–1.00)
Chloride: 98 mmol/L — ABNORMAL LOW (ref 101–111)
GFR calc non Af Amer: >60 mL/min (ref >60)
GLUCOSE: 211 mg/dL — AB (ref 65–99)
Potassium: 4.2 mmol/L (ref 3.5–5.1)
Sodium: 132 mmol/L — ABNORMAL LOW (ref 135–145)

## 2017-03-19 LAB — CBC WITH DIFFERENTIAL/PLATELET
BASOS PCT: 0 %
Basophils Absolute: 0 10*3/uL (ref 0.0–0.1)
EOS ABS: 0 10*3/uL (ref 0.0–0.7)
Eosinophils Relative: 0 %
HCT: 27.4 % — ABNORMAL LOW (ref 36.0–46.0)
Hemoglobin: 9 g/dL — ABNORMAL LOW (ref 12.0–15.0)
Lymphocytes Relative: 14 %
Lymphs Abs: 1.8 10*3/uL (ref 0.7–4.0)
MCH: 21.4 pg — AB (ref 26.0–34.0)
MCHC: 32.8 g/dL (ref 30.0–36.0)
MCV: 65.2 fL — ABNORMAL LOW (ref 78.0–100.0)
Monocytes Absolute: 1.6 10*3/uL — ABNORMAL HIGH (ref 0.1–1.0)
Monocytes Relative: 12 %
Neutro Abs: 9.6 10*3/uL — ABNORMAL HIGH (ref 1.7–7.7)
Neutrophils Relative %: 74 %
PLATELETS: 369 10*3/uL (ref 150–400)
RBC: 4.2 MIL/uL (ref 3.87–5.11)
RDW: 15.5 % (ref 11.5–15.5)
WBC: 13 10*3/uL — ABNORMAL HIGH (ref 4.0–10.5)

## 2017-03-19 MED ORDER — IOPAMIDOL (ISOVUE-370) INJECTION 76%
INTRAVENOUS | Status: AC
  Filled 2017-03-19: qty 100

## 2017-03-19 MED ORDER — ONDANSETRON HCL 4 MG/2ML IJ SOLN
4.0000 mg | Freq: Once | INTRAMUSCULAR | Status: AC
  Administered 2017-03-19: 4 mg via INTRAVENOUS
  Filled 2017-03-19: qty 2

## 2017-03-19 MED ORDER — SODIUM CHLORIDE 0.9 % IV BOLUS (SEPSIS)
1000.0000 mL | Freq: Once | INTRAVENOUS | Status: AC
  Administered 2017-03-19: 1000 mL via INTRAVENOUS

## 2017-03-19 MED ORDER — BUTALBITAL-APAP-CAFFEINE 50-325-40 MG PO TABS: 1.0000 | ORAL_TABLET | Freq: Four times a day (QID) | ORAL | 0 refills | Status: DC | PRN

## 2017-03-19 MED ORDER — IOPAMIDOL (ISOVUE-370) INJECTION 76%
100.0000 mL | Freq: Once | INTRAVENOUS | Status: AC | PRN
  Administered 2017-03-19: 67 mL via INTRAVENOUS

## 2017-03-19 MED ORDER — HYDROMORPHONE HCL 1 MG/ML IJ SOLN
1.0000 mg | Freq: Once | INTRAMUSCULAR | Status: AC
  Administered 2017-03-19: 1 mg via INTRAVENOUS
  Filled 2017-03-19: qty 1

## 2017-03-19 NOTE — ED Triage Notes (Signed)
Per pt, states headache since yesterday-states causing photophobia and facial pain

## 2017-03-19 NOTE — ED Notes (Signed)
Patient transported to CT 

## 2017-03-19 NOTE — ED Notes (Signed)
Patient given turkey sandwich and water

## 2017-03-19 NOTE — Discharge Instructions (Signed)
Read the information below.  Use the prescribed medication as directed.  Please discuss all new medications with your pharmacist.  You may return to the Emergency Department at any time for worsening condition or any new symptoms that concern you.     Please drink plenty of fluids to stay hydrated.  Drink until your urine is light yellow or clear.  You may also try meal replacement drinks and shakes such as Ensure and Boost to add nutrition to your diet.   You are having a headache. No specific cause was found today for your headache. It may have been a migraine or other cause of headache. Stress, anxiety, fatigue, and depression are common triggers for headaches. Your headache today does not appear to be life-threatening or require hospitalization, but often the exact cause of headaches is not determined in the emergency department. Therefore, follow-up with your doctor is very important to find out what may have caused your headache, and whether or not you need any further diagnostic testing or treatment. Sometimes headaches can appear benign (not harmful), but then more serious symptoms can develop which should prompt an immediate re-evaluation by your doctor or the emergency department. SEEK MEDICAL ATTENTION IF: You develop possible problems with medications prescribed.  The medications don't resolve your headache, if it recurs , or if you have multiple episodes of vomiting or can't take fluids. You have a change from the usual headache. RETURN IMMEDIATELY IF you develop a sudden, severe headache or confusion, become poorly responsive or faint, develop a fever above 100.62F or problem breathing, have a change in speech, vision, swallowing, or understanding, or develop new weakness, numbness, tingling, incoordination, or have a seizure.

## 2017-03-19 NOTE — ED Provider Notes (Signed)
[facility] DEPT Provider Note   CSN: [id number] Arrival date & time: 03/19/17  4315     History   Chief Complaint Chief Complaint  Patient presents with  . Headache    HPI Hayley Collins is a 46 y.o. female.  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  The pain is intense, 12/10, gradually increased, worse with lifting her head, associated light sensitivity.  She has also had two weeks of cough, SOB, left sided chest pain that radiates through to the back is somewhat pleuritic.  Was seen in ED 03/16/17 but without significant improvement since.  Fever of 100.8 this morning.  Has noticed her urine is brown.  Husband notes she has not been eating or drinking well and has lost a lot of weight recently.   Denies nasal congestion, sore throat, abdominal complaints.    Past Medical History:  Diagnosis Date  . Diabetes mellitus   . Fatty liver   . GERD (gastroesophageal reflux disease)   . Hypertension     Patient Active Problem List   Diagnosis Date Noted  . 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 Gynecologic malignancy ([place]) 01/16/2017  . Ovarian mass, right 12/26/2016  . Enlarged uterus 12/26/2016  . Liver masses 12/26/2016  . Diabetes ([place]) 12/26/2016    History reviewed. No pertinent surgical history.  OB History    No data available       Home Medications    Prior to Admission medications   Medication Sig Start Date End Date Taking? Authorizing Provider  albuterol (PROVENTIL HFA;VENTOLIN HFA) 108 (90 Base) MCG/ACT inhaler Inhale 1-2 puffs into the lungs every 6 (six) hours as needed for wheezing. 03/16/17  Yes Tanna Furry, MD  beclomethasone (QVAR) 80 MCG/ACT inhaler Inhale 2 puffs into the lungs 2 (two) times daily as needed (asthma).    Yes [provider]  losartan (COZAAR) 50 MG tablet Take 50  mg by mouth daily.   Yes [provider]  metFORMIN (GLUCOPHAGE) 1000 MG tablet Take 1,000 mg by mouth daily with breakfast.    Yes [provider]  predniSONE (DELTASONE) 20 MG tablet Take 1 tablet (20 mg total) by mouth 2 (two) times daily with a meal. 03/16/17  Yes Tanna Furry, MD  promethazine-dextromethorphan (PROMETHAZINE-DM) 6.25-15 MG/5ML syrup Take 5 mLs by mouth 4 (four) times daily as needed for cough. 03/16/17  Yes Tanna Furry, MD  butalbital-acetaminophen-caffeine (FIORICET, [facility]) [phone] MG tablet Take 1-2 tablets by mouth every 6 (six) hours as needed for headache. 03/19/17 03/19/18  Clayton Bibles, PA-C    Family History Family History  Problem Relation Age of Onset  . Hypertension Other   . Diabetes Other     Social History Social History  Substance Use Topics  . Smoking status: Never Smoker  . Smokeless tobacco: Never Used  . Alcohol use No     Allergies   Patient has no known allergies.   Review of Systems Review of Systems  All other systems reviewed and are negative.    Physical Exam Updated Vital Signs BP 134/82   Pulse 86   Temp 98.4 F (36.9 C) (Oral)   Resp (!) 21   Wt 82.1 kg (181 lb 1.6 oz)   LMP 02/17/2017   SpO2 100%   BMI 29.10 kg/m   Physical Exam  Constitutional: She appears  well-developed and well-nourished. No distress.  HENT:  Head: Normocephalic and atraumatic.  Neck: Neck supple.  Cardiovascular: Normal rate and regular rhythm.   Pulmonary/Chest: Effort normal and breath sounds normal. No respiratory distress. She has no wheezes. She has no rales.  Abdominal: Soft. She exhibits no distension. There is no tenderness. There is no rebound and no guarding.  Musculoskeletal: She exhibits no edema.  Neurological: She is alert. No cranial nerve deficit. She exhibits normal muscle tone.  Light sensitive.  Ranges neck but has pain with any movement of her head of the pillow.    Skin: She is not diaphoretic.  Nursing note  and vitals reviewed.    ED Treatments / Results  Labs (all labs ordered are listed, but only abnormal results are displayed) Labs Reviewed  BASIC METABOLIC PANEL - Abnormal; Notable for the following:       Result Value   Sodium 132 (*)    Chloride 98 (*)    Glucose, Bld 211 (*)    All other components within normal limits  CBC WITH DIFFERENTIAL/PLATELET - Abnormal; Notable for the following:    WBC 13.0 (*)    Hemoglobin 9.0 (*)    HCT 27.4 (*)    MCV 65.2 (*)    MCH 21.4 (*)    Neutro Abs 9.6 (*)    Monocytes Absolute 1.6 (*)    All other components within normal limits  URINALYSIS, ROUTINE W REFLEX MICROSCOPIC - Abnormal; Notable for the following:    Color, Urine AMBER (*)    APPearance CLOUDY (*)    Glucose, UA >=500 (*)    Protein, ur 30 (*)    Bacteria, UA FEW (*)    Squamous Epithelial / LPF 0-5 (*)    All other components within normal limits  URINE CULTURE    EKG  EKG Interpretation  Date/Time:  Monday March 19 2017 14:10:24 EDT Ventricular Rate:  88 PR Interval:    QRS Duration: 76 QT Interval:  356 QTC Calculation: 431 R Axis:   75 Text Interpretation:  Sinus rhythm Nonspecific ST abnormality Confirmed by Ashok Cordia  MD, Lennette Bihari ([id number]) on 03/19/2017 2:15:14 PM Also confirmed by Ashok Cordia  MD, Lennette Bihari ([id number]), editor Drema Pry [phone])  on 03/19/2017 2:25:38 PM       Radiology Ct Head Wo Contrast  Result Date: 03/19/2017 CLINICAL DATA:  Headache and photophobia. History of metastatic ovarian carcinoma EXAM: CT HEAD WITHOUT CONTRAST TECHNIQUE: Contiguous axial images were obtained from the base of the skull through the vertex without intravenous contrast. COMPARISON:  None. FINDINGS: Brain: The ventricles are normal in size and configuration. There is no intracranial mass, hemorrhage, extra-axial fluid collection, or midline shift. Gray-white compartments are normal. No acute infarct evident. Vascular: There is no hyperdense vessel. There is no appreciable  vascular calcification. Skull: The bony calvarium appears intact. Sinuses/Orbits: There is mild mucosal thickening in several ethmoid air cells. Other visualized paranasal sinuses are clear. Visualized orbits appear symmetric bilaterally. Other: Mastoid air cells are clear. IMPRESSION: Mild ethmoid sinus disease. No intracranial mass, hemorrhage, or extra-axial fluid collection. The gray-white compartments appear within normal limits. Electronically Signed   By: Lowella Grip III M.D.   On: 03/19/2017 15:33   Ct Angio Chest Pe W/cm &/or Wo Cm  Result Date: 03/19/2017 CLINICAL DATA:  Chest pain, cough, body aches, fever. GYN malignancy with liver metastases. EXAM: CT ANGIOGRAPHY CHEST WITH CONTRAST TECHNIQUE: Multidetector CT imaging of the chest was performed using the standard protocol during bolus  administration of intravenous contrast. Multiplanar CT image reconstructions and MIPs were obtained to evaluate the vascular anatomy. CONTRAST:  67 mL Isovue 370 IV COMPARISON:  CT chest dated 02/01/2017. CT abdomen/pelvis dated 12/19/2016. FINDINGS: Cardiovascular: Satisfactory opacification of the bilateral pulmonary arteries to the segmental level. No evidence of pulmonary embolism. The heart is normal in size.  No pericardial effusion. No evidence of thoracic aortic aneurysm or dissection. Mediastinum/Nodes: No suspicious mediastinal lymphadenopathy. Visualized thyroid is unremarkable. Lungs/Pleura: No suspicious pulmonary nodules. No focal consolidation. Trace bilateral pleural effusions. No pneumothorax. Upper Abdomen: Visualized upper abdomen is notable for a macronodular hepatic contour, suggesting pseudocirrhosis, with innumerable hepatic metastases (incompletely visualized). Representative lesion in segment 4A measures 3.0 cm (series 2/image 68). Musculoskeletal: Degenerative changes of the visualized thoracolumbar spine. Review of the MIP images confirms the above findings. IMPRESSION: No evidence of  pulmonary embolism. Trace bilateral pleural effusions. No evidence of metastatic disease in the chest. Innumerable hepatic metastases, incompletely visualized. Electronically Signed   By: Julian Hy M.D.   On: 03/19/2017 15:43    Procedures Procedures (including critical care time)  Medications Ordered in ED Medications  HYDROmorphone (DILAUDID) injection 1 mg (1 mg Intravenous Given 03/19/17 1406)  sodium chloride 0.9 % bolus 1,000 mL (0 mLs Intravenous Stopped 03/19/17 1542)  ondansetron (ZOFRAN) injection 4 mg (4 mg Intravenous Given 03/19/17 1406)  iopamidol (ISOVUE-370) 76 % injection 100 mL (67 mLs Intravenous Contrast Given 03/19/17 1514)  sodium chloride 0.9 % bolus 1,000 mL (0 mLs Intravenous Stopped 03/19/17 1642)     Initial Impression / Assessment and Plan / ED Course  I have reviewed the triage vital signs and the nursing notes.  Pertinent labs & imaging results that were available during my care of the patient were reviewed by me and considered in my medical decision making (see chart for details).  Clinical Course as of Mar 20 1703  Mon Mar 19, 2017  1605 Pt reports headache much improved.  She is able to range her neck well without pain.    [EW]    Clinical Course User Index [EW] [place], [place], [place]    Afebrile, nontoxic patient with known metastatic cancer (pt not receiving treatment) with decreased PO intake, recent URI symptoms/cough, p/w headache.  No focal neurologic complaints or noted deficits.  No nuchal rigidity.  No red flags for headache.  Suspect headache is combination from URI and dehydration.  Much improved with IVF, pain medication.  Labs significant for dehydration, leukocytosis.  CT scans negative.   2L IVF given in ED, food/drink also offered.  Pt also seen by Dr Ashok Cordia.  D/C home with PCP follow up, symptomatic medications.  Encouraged hydration at home.  Discussed result, findings, treatment, and follow up  with patient.  Pt given return precautions.  Pt  verbalizes understanding and agrees with plan.       Final Clinical Impressions(s) / ED Diagnoses   Final diagnoses:  Bad headache  Dehydration    New Prescriptions Discharge Medication List as of 03/19/2017  4:34 PM    START taking these medications   Details  butalbital-acetaminophen-caffeine (FIORICET, ESGIC) 50-325-40 MG tablet Take 1-2 tablets by mouth every 6 (six) hours as needed for headache., Starting Mon 03/19/2017, Until Tue 03/19/2018, Print         [place], [place], PA-C 03/19/17 1704    Lajean Saver, MD 03/26/17 [phone]

## 2017-03-21 LAB — URINE CULTURE

## 2017-04-16 ENCOUNTER — Encounter (HOSPITAL_COMMUNITY): Payer: Self-pay | Admitting: Emergency Medicine

## 2017-04-16 ENCOUNTER — Emergency Department (HOSPITAL_COMMUNITY)
Admission: EM | Admit: 2017-04-16 | Discharge: 2017-04-16 | Disposition: A | Discharge status: Home / Self Care | Payer: Medicaid Other | Attending: Emergency Medicine | Admitting: Emergency Medicine

## 2017-04-16 ENCOUNTER — Emergency Department (HOSPITAL_COMMUNITY): Payer: Medicaid Other

## 2017-04-16 DIAGNOSIS — E119 Type 2 diabetes mellitus without complications: Secondary | ICD-10-CM | POA: Insufficient documentation

## 2017-04-16 DIAGNOSIS — Z7984 Long term (current) use of oral hypoglycemic drugs: Secondary | ICD-10-CM | POA: Diagnosis not present

## 2017-04-16 DIAGNOSIS — R1010 Upper abdominal pain, unspecified: Secondary | ICD-10-CM | POA: Diagnosis present

## 2017-04-16 DIAGNOSIS — R17 Unspecified jaundice: Secondary | ICD-10-CM

## 2017-04-16 DIAGNOSIS — Z79899 Other long term (current) drug therapy: Secondary | ICD-10-CM | POA: Diagnosis not present

## 2017-04-16 DIAGNOSIS — C799 Secondary malignant neoplasm of unspecified site: Secondary | ICD-10-CM

## 2017-04-16 HISTORY — DX: Malignant (primary) neoplasm, unspecified: C80.1

## 2017-04-16 LAB — COMPREHENSIVE METABOLIC PANEL
ALK PHOS: 791 U/L — AB (ref 38–126)
ALT: 64 U/L — AB (ref 14–54)
AST: 103 U/L — ABNORMAL HIGH (ref 15–41)
Albumin: 2.4 g/dL — ABNORMAL LOW (ref 3.5–5.0)
Anion gap: 10 (ref 5–15)
BILIRUBIN TOTAL: 6.5 mg/dL — AB (ref 0.3–1.2)
BUN: 10 mg/dL (ref 6–20)
CALCIUM: 9.7 mg/dL (ref 8.9–10.3)
CO2: 23 mmol/L (ref 22–32)
CREATININE: 0.64 mg/dL (ref 0.44–1.00)
Chloride: 99 mmol/L — ABNORMAL LOW (ref 101–111)
Glucose, Bld: 235 mg/dL — ABNORMAL HIGH (ref 65–99)
Potassium: 4.3 mmol/L (ref 3.5–5.1)
Sodium: 132 mmol/L — ABNORMAL LOW (ref 135–145)
TOTAL PROTEIN: 8.5 g/dL — AB (ref 6.5–8.1)

## 2017-04-16 LAB — CBC
HCT: 26.4 % — ABNORMAL LOW (ref 36.0–46.0)
Hemoglobin: 8.2 g/dL — ABNORMAL LOW (ref 12.0–15.0)
MCH: 21.5 pg — AB (ref 26.0–34.0)
MCHC: 31.1 g/dL (ref 30.0–36.0)
MCV: 69.1 fL — ABNORMAL LOW (ref 78.0–100.0)
PLATELETS: 717 10*3/uL — AB (ref 150–400)
RBC: 3.82 MIL/uL — AB (ref 3.87–5.11)
RDW: 21.7 % — ABNORMAL HIGH (ref 11.5–15.5)
WBC: 15.3 10*3/uL — ABNORMAL HIGH (ref 4.0–10.5)

## 2017-04-16 LAB — URINALYSIS, ROUTINE W REFLEX MICROSCOPIC
Glucose, UA: 50 mg/dL — AB
HGB URINE DIPSTICK: NEGATIVE
Ketones, ur: NEGATIVE mg/dL
Leukocytes, UA: NEGATIVE
Nitrite: NEGATIVE
PROTEIN: NEGATIVE mg/dL
Specific Gravity, Urine: 1.023 (ref 1.005–1.030)
pH: 5 (ref 5.0–8.0)

## 2017-04-16 LAB — LIPASE, BLOOD: Lipase: 39 U/L (ref 11–51)

## 2017-04-16 MED ORDER — IOPAMIDOL (ISOVUE-300) INJECTION 61%
100.0000 mL | Freq: Once | INTRAVENOUS | Status: AC | PRN
  Administered 2017-04-16: 100 mL via INTRAVENOUS

## 2017-04-16 MED ORDER — IOPAMIDOL (ISOVUE-300) INJECTION 61%
INTRAVENOUS | Status: AC
  Filled 2017-04-16: qty 100

## 2017-04-16 MED ORDER — OXYCODONE HCL 5 MG PO TABS: 5.0000 mg | ORAL_TABLET | Freq: Four times a day (QID) | ORAL | 0 refills | Status: DC | PRN

## 2017-04-16 MED ORDER — SODIUM CHLORIDE 0.9 % IV BOLUS (SEPSIS)
1000.0000 mL | Freq: Once | INTRAVENOUS | Status: AC
  Administered 2017-04-16: 1000 mL via INTRAVENOUS

## 2017-04-16 NOTE — ED Notes (Signed)
Patient given large cup of water to help facilitate urine sample. Patient requesting that we not cath her.

## 2017-04-16 NOTE — Discharge Instructions (Signed)
Read the information below.  Use the prescribed medication as directed.  Please discuss all new medications with your pharmacist.  You may return to the Emergency Department at any time for worsening condition or any new symptoms that concern you.     Please consider calling Palliative Care to see what services and help they can provide you as your cancer progresses.    If you develop severe pain, uncontrolled vomiting, or have any concerns we can help you with, please return to the Emergency Department for a new evaluation.    Your urine does not appear to be infected.  Continue to drink plenty of fluids.  We will send your urine for culture to make sure there is no infection.   You will be called and antibiotics sent to your pharmacy if the culture shows infection.

## 2017-04-16 NOTE — ED Triage Notes (Addendum)
Pt complaint of mid abdominal knot with nausea, vomiting, dark urine, and jaundice worsening over two weeks; hx of stage 4 ovarian cancer; no currently taken treatment.

## 2017-04-16 NOTE — ED Provider Notes (Signed)
[facility] DE[facility] Provider Note   CSN: [id number] Arrival date & time: 04/16/17  1226     History   Chief Complaint Chief Complaint  Patient presents with  . Abdominal Pain    HPI Hayley Collins is a 46 y.o. female.  HPI   Pt with hx liver metastases and possible ovarian primary, declined chemotherapy p/w palpable mass in her upper abdomen and discomfort in her upper abdomen .  Has had chronic cough with posttussive emesis.  Had single episode of hematochezia a few weeks ago and therefore though the mass and pain might be related to her stomach.  She has a good appetite but decreased PO intake because she feels full all the time.  Has had urinary frequency and urgency recently.  Does admit that her eyes have become yellow.  Notes persistent fevers at night.  No night sweats.  No CP, SOB. PCP Dr  [name]     Past Medical History:  Diagnosis Date  . Cancer ([place])   . Diabetes mellitus   . Fatty liver   . GERD (gastroesophageal reflux disease)   . Hypertension     Patient Active Problem List   Diagnosis Date Noted  . 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 Gynecologic malignancy ([place]) 01/16/2017  . Ovarian mass, right 12/26/2016  . Enlarged uterus 12/26/2016  . Liver masses 12/26/2016  . Diabetes ([place]) 12/26/2016    History reviewed. No pertinent surgical history.  OB History    No data available       Home Medications    Prior to Admission medications   Medication Sig Start Date End Date Taking? Authorizing Provider  albuterol (PROVENTIL HFA;VENTOLIN HFA) 108 (90 Base) MCG/ACT inhaler Inhale 1-2 puffs into the lungs every 6 (six) hours as needed for wheezing. 03/16/17  Yes Tanna Furry, MD  beclomethasone (QVAR) 80 MCG/ACT inhaler Inhale 2 puffs into the lungs 2 (two) times daily as needed (asthma).    Yes [provider]  butalbital-acetaminophen-caffeine (FIORICET, ESGIC) [phone] MG tablet Take 1-2 tablets by mouth every 6 (six)  hours as needed for headache. 03/19/17 03/19/18 Yes Gwendolyne Welford, PA-C  IBU 800 MG tablet Take 800 mg by mouth 3 (three) times daily as needed for pain. 04/02/17  Yes [provider]  losartan (COZAAR) 50 MG tablet Take 50 mg by mouth daily.   Yes [provider]  metFORMIN (GLUCOPHAGE) 1000 MG tablet Take 1,000 mg by mouth daily with breakfast.    Yes [provider]  promethazine-dextromethorphan (PROMETHAZINE-DM) 6.25-15 MG/5ML syrup Take 5 mLs by mouth 4 (four) times daily as needed for cough. 03/16/17  Yes Tanna Furry, MD  oxyCODONE (OXY IR/ROXICODONE) 5 MG immediate release tablet Take 1 tablet (5 mg total) by mouth every 6 (six) hours as needed for severe pain. 04/16/17   Clayton Bibles, PA-C  predniSONE (DELTASONE) 20 MG tablet Take 1 tablet (20 mg total) by mouth 2 (two) times daily with a meal. Patient not taking: Reported on 04/16/2017 03/16/17   Tanna Furry, MD    Family History Family History  Problem Relation Age of Onset  . Hypertension Other   . Diabetes Other     Social History Social History  Substance Use Topics  . Smoking status: Never Smoker  . Smokeless tobacco: Never Used  . Alcohol use No     Allergies   Patient has no known allergies.   Review of Systems Review of Systems  All other systems reviewed and are  negative.    Physical Exam Updated Vital Signs BP 127/67 (BP Location: Left Arm)   Pulse 92   Temp 99.6 F (37.6 C) (Rectal)   Resp 16   Ht 5\' 6"  (1.676 m)   Wt 77.3 kg (170 lb 8 oz)   LMP 03/21/2017   SpO2 100%   BMI 27.52 kg/m   Physical Exam  Constitutional: She appears well-developed and well-nourished. No distress.  HENT:  Head: Normocephalic and atraumatic.  Eyes: Scleral icterus is present.  Neck: Neck supple.  Cardiovascular: Normal rate and regular rhythm.   Pulmonary/Chest: Effort normal and breath sounds normal. No respiratory distress. She has no wheezes. She has no rales.  Abdominal: Soft. She exhibits mass  (large firm upper abdominal mass ). She exhibits no distension. There is no tenderness. There is no rebound and no guarding.  Neurological: She is alert.  Skin: She is not diaphoretic.  Nursing note and vitals reviewed.    ED Treatments / Results  Labs (all labs ordered are listed, but only abnormal results are displayed) Labs Reviewed  COMPREHENSIVE METABOLIC PANEL - Abnormal; Notable for the following:       Result Value   Sodium 132 (*)    Chloride 99 (*)    Glucose, Bld 235 (*)    Total Protein 8.5 (*)    Albumin 2.4 (*)    AST 103 (*)    ALT 64 (*)    Alkaline Phosphatase 791 (*)    Total Bilirubin 6.5 (*)    All other components within normal limits  CBC - Abnormal; Notable for the following:    WBC 15.3 (*)    RBC 3.82 (*)    Hemoglobin 8.2 (*)    HCT 26.4 (*)    MCV 69.1 (*)    MCH 21.5 (*)    RDW 21.7 (*)    Platelets 717 (*)    All other components within normal limits  URINALYSIS, ROUTINE W REFLEX MICROSCOPIC - Abnormal; Notable for the following:    Color, Urine AMBER (*)    Glucose, UA 50 (*)    Bilirubin Urine MODERATE (*)    All other components within normal limits  LIPASE, BLOOD    EKG  EKG Interpretation None       Radiology Ct Abdomen Pelvis W Contrast  Result Date: 04/16/2017 CLINICAL DATA:  Mid abdomen nausea and vomiting, jaundice and dark urine over 2 weeks. Patient has a history of known liver metastasis with question ovarian primary. EXAM: CT ABDOMEN AND PELVIS WITH CONTRAST TECHNIQUE: Multidetector CT imaging of the abdomen and pelvis was performed using the standard protocol following bolus administration of intravenous contrast. CONTRAST:  161mL ISOVUE-300 IOPAMIDOL (ISOVUE-300) INJECTION 61% COMPARISON:  December 19, 2016 FINDINGS: Lower chest: No acute abnormality. Hepatobiliary: There are innumerable heterogeneously enhancing masses in a markedly enlarged liver consistent with patient's known liver metastasis. This is significantly worsened  since prior CT of December 19, 2016. The gallbladder is normal. Pancreas: Unremarkable. No pancreatic ductal dilatation or surrounding inflammatory changes. Spleen: Normal in size without focal abnormality. Adrenals/Urinary Tract: Adrenal glands are unremarkable. Kidneys are normal, without renal calculi, focal lesion, or hydronephrosis. Bladder is unremarkable. Stomach/Bowel: Stomach is within normal limits. Appendix appears normal. No evidence of bowel wall thickening, distention, or inflammatory changes. Vascular/Lymphatic: No significant vascular findings are present. No enlarged abdominal or pelvic lymph nodes. Reproductive: Right pelvic heterogeneously enhancing mass measuring 8.1 x 7.8 cm is identified. There is further inferior and posterior extension of  the right pelvic heterogeneous mass compared to the previous exam. The uterus is markedly enlarged and extends superiorly to the level of the umbilicus. Multiple enhancing masses are identified in the uterus. Some the masses in the uterus appeared enlarged compared to the previous CT. Small amount of ascites is identified in the pelvis. Other: No abdominal wall hernia.  No pneumothorax. Musculoskeletal: Degenerative joint changes of the spine are noted. IMPRESSION: Hepatic metastasis throughout liver significantly worsened compared to prior CT of December 19, 2016. Enlarged uterus with right pelvic heterogeneous enhancing mass, larger compared to previous CT. Electronically Signed   By: Abelardo Diesel M.D.   On: 04/16/2017 15:43    Procedures Procedures (including critical care time)  Medications Ordered in ED Medications  iopamidol (ISOVUE-300) 61 % injection (not administered)  sodium chloride 0.9 % bolus 1,000 mL (0 mLs Intravenous Stopped 04/16/17 1725)  iopamidol (ISOVUE-300) 61 % injection 100 mL (100 mLs Intravenous Contrast Given 04/16/17 1515)     Initial Impression / Assessment and Plan / ED Course  I have reviewed the triage vital signs and the  nursing notes.  Pertinent labs & imaging results that were available during my care of the patient were reviewed by me and considered in my medical decision making (see chart for details).     Afebrile nontoxic patient with progressive metastatic cancer.  Pt has declined chemotherapy and today she and her husband confirm they are not interested in pursuing treatment.   She came to ED today because she felt the large mass in her abdomen and wanted to know what the cause was.  We have discussed it at length.  She and her husband are both aware that this is spreading cancer and multiple tumors that will cause her to die.  I have offered to consult palliative care to speak with them about options for symptom management, they have declined.  Pt states she has the palliative care contact information at home and will think about calling them.  She is not in severe pain.  She has obstruction but I doubt acute cholangitis.  She has normal mentation.  She was given IVF in the ED for hydration.  Discussed pt and plan with Dr Eulis Foster.  Pt d/c home with PCP, palliative care follow up.  Discussed result, findings, treatment, and follow up  with patient.  Pt given return precautions.  Pt verbalizes understanding and agrees with plan.    .    Final Clinical Impressions(s) / ED Diagnoses   Final diagnoses:  Metastatic cancer [facility])  Jaundice    New Prescriptions Current Discharge Medication List       Clayton Bibles, [place] 04/16/17 1732    Daleen Bo, MD 04/16/17 2050

## 2017-04-16 NOTE — ED Notes (Signed)
Patient tried for a while to obtain urine sample. Patient was unsuccessful. RN notified. Patient will try again in a little bit.

## 2017-04-16 NOTE — ED Notes (Signed)
Patient transported to CT 

## 2017-04-29 ENCOUNTER — Encounter (HOSPITAL_COMMUNITY): Payer: Self-pay

## 2017-04-29 ENCOUNTER — Emergency Department (HOSPITAL_COMMUNITY)
Admission: EM | Admit: 2017-04-29 | Discharge: 2017-04-30 | Disposition: A | Discharge status: Home / Self Care | Payer: Medicaid Other | Attending: Emergency Medicine | Admitting: Emergency Medicine

## 2017-04-29 DIAGNOSIS — D509 Iron deficiency anemia, unspecified: Secondary | ICD-10-CM

## 2017-04-29 DIAGNOSIS — K831 Obstruction of bile duct: Secondary | ICD-10-CM | POA: Insufficient documentation

## 2017-04-29 DIAGNOSIS — E871 Hypo-osmolality and hyponatremia: Secondary | ICD-10-CM | POA: Diagnosis not present

## 2017-04-29 DIAGNOSIS — C561 Malignant neoplasm of right ovary: Secondary | ICD-10-CM | POA: Insufficient documentation

## 2017-04-29 DIAGNOSIS — E119 Type 2 diabetes mellitus without complications: Secondary | ICD-10-CM | POA: Insufficient documentation

## 2017-04-29 DIAGNOSIS — R1011 Right upper quadrant pain: Secondary | ICD-10-CM | POA: Diagnosis present

## 2017-04-29 DIAGNOSIS — Z79899 Other long term (current) drug therapy: Secondary | ICD-10-CM | POA: Insufficient documentation

## 2017-04-29 DIAGNOSIS — C787 Secondary malignant neoplasm of liver and intrahepatic bile duct: Secondary | ICD-10-CM | POA: Diagnosis not present

## 2017-04-29 DIAGNOSIS — R109 Unspecified abdominal pain: Secondary | ICD-10-CM

## 2017-04-29 DIAGNOSIS — Z7984 Long term (current) use of oral hypoglycemic drugs: Secondary | ICD-10-CM | POA: Insufficient documentation

## 2017-04-29 NOTE — ED Triage Notes (Signed)
PT states she has  stage 4 Ovarian ca that has spread to liver and has been having ruq and luq pain that radiates to back. PT jaundiced. Pt endorses n&v. Is not receiving chemo

## 2017-04-30 ENCOUNTER — Emergency Department (HOSPITAL_COMMUNITY): Payer: Medicaid Other

## 2017-04-30 LAB — DIFFERENTIAL
BASOS PCT: 0 %
Band Neutrophils: 0 %
Basophils Absolute: 0 10*3/uL (ref 0.0–0.1)
Blasts: 0 %
EOS ABS: 0 10*3/uL (ref 0.0–0.7)
Eosinophils Relative: 0 %
LYMPHS PCT: 25 %
Lymphs Abs: 6.2 10*3/uL — ABNORMAL HIGH (ref 0.7–4.0)
MONO ABS: 1.7 10*3/uL — AB (ref 0.1–1.0)
MYELOCYTES: 0 %
Metamyelocytes Relative: 0 %
Monocytes Relative: 7 %
NEUTROS PCT: 68 %
NRBC: 0 /100{WBCs}
Neutro Abs: 16.9 10*3/uL — ABNORMAL HIGH (ref 1.7–7.7)
OTHER: 0 %
PROMYELOCYTES ABS: 0 %

## 2017-04-30 LAB — COMPREHENSIVE METABOLIC PANEL
ALK PHOS: 850 U/L — AB (ref 38–126)
ALT: 80 U/L — ABNORMAL HIGH (ref 14–54)
ANION GAP: 14 (ref 5–15)
AST: 120 U/L — ABNORMAL HIGH (ref 15–41)
Albumin: 2 g/dL — ABNORMAL LOW (ref 3.5–5.0)
BILIRUBIN TOTAL: 17.4 mg/dL — AB (ref 0.3–1.2)
BUN: 18 mg/dL (ref 6–20)
CALCIUM: 9.8 mg/dL (ref 8.9–10.3)
CO2: 19 mmol/L — ABNORMAL LOW (ref 22–32)
Chloride: 91 mmol/L — ABNORMAL LOW (ref 101–111)
Creatinine, Ser: 1.13 mg/dL — ABNORMAL HIGH (ref 0.44–1.00)
GFR, EST NON AFRICAN AMERICAN: 57 mL/min — AB (ref >60)
GLUCOSE: 214 mg/dL — AB (ref 65–99)
Potassium: 5 mmol/L (ref 3.5–5.1)
Sodium: 124 mmol/L — ABNORMAL LOW (ref 135–145)
TOTAL PROTEIN: 8.5 g/dL — AB (ref 6.5–8.1)

## 2017-04-30 LAB — CBC
HEMATOCRIT: 27.1 % — AB (ref 36.0–46.0)
HEMOGLOBIN: 8 g/dL — AB (ref 12.0–15.0)
MCH: 21.7 pg — ABNORMAL LOW (ref 26.0–34.0)
MCHC: 29.5 g/dL — AB (ref 30.0–36.0)
MCV: 73.6 fL — ABNORMAL LOW (ref 78.0–100.0)
Platelets: 654 10*3/uL — ABNORMAL HIGH (ref 150–400)
RBC: 3.68 MIL/uL — ABNORMAL LOW (ref 3.87–5.11)
RDW: 20.5 % — AB (ref 11.5–15.5)
WBC: 24.8 10*3/uL — AB (ref 4.0–10.5)

## 2017-04-30 LAB — LIPASE, BLOOD: Lipase: 29 U/L (ref 11–51)

## 2017-04-30 MED ORDER — OXYCODONE HCL 5 MG PO TABS: 10.0000 mg | ORAL_TABLET | ORAL | 0 refills | Status: DC | PRN

## 2017-04-30 MED ORDER — ONDANSETRON 4 MG PO TBDP
8.0000 mg | ORAL_TABLET | Freq: Once | ORAL | Status: AC
  Administered 2017-04-30: 8 mg via ORAL
  Filled 2017-04-30: qty 2

## 2017-04-30 MED ORDER — SODIUM CHLORIDE 0.9 % IV BOLUS (SEPSIS)
1000.0000 mL | Freq: Once | INTRAVENOUS | Status: AC
  Administered 2017-04-30: 1000 mL via INTRAVENOUS

## 2017-04-30 MED ORDER — ONDANSETRON HCL 4 MG PO TABS: 4.0000 mg | ORAL_TABLET | Freq: Four times a day (QID) | ORAL | 0 refills | Status: DC

## 2017-04-30 MED ORDER — OXYCODONE HCL 5 MG PO TABS
10.0000 mg | ORAL_TABLET | Freq: Once | ORAL | Status: AC
  Administered 2017-04-30: 10 mg via ORAL
  Filled 2017-04-30: qty 2

## 2017-04-30 NOTE — ED Notes (Signed)
Husband asked if we could let the pt sleep for an hour b/c she had not slept so sound in quite a while.  Agricultural consultant and physician agreed.

## 2017-04-30 NOTE — ED Provider Notes (Signed)
[facility] DEPT Provider Note   CSN: [id number] Arrival date & time: 04/29/17  2333     History   Chief Complaint Chief Complaint  Patient presents with  . Abdominal Pain    HPI Hayley Collins is a 46 y.o. female.  The history is provided by the patient.  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 She has refused chemotherapy. She comes in complaining of right upper quadrant pain for the last month. Pain radiates the back. It keeps her up at night. She had been given a prescription for oxycodone, but it did not help the pain and it made her nauseous. She is also complaining of anorexia. There is a cough which is productive of some clear sputum. Cough has been present for 2 months and is getting worse. Her husband is concerned about her anorexia and weight loss. She still apparently does not wish to proceed with chemotherapy.   Past Medical History:  Diagnosis Date  . Cancer ([place])   . Diabetes mellitus   . Fatty liver   . GERD (gastroesophageal reflux disease)   . Hypertension     Patient Active Problem List   Diagnosis Date Noted  . Secondary malignant neoplasm of liver ([place]) 01/16/2017  . Gynecologic malignancy ([place]) 01/16/2017  . Ovarian mass, right 12/26/2016  . Enlarged uterus 12/26/2016  . Liver masses 12/26/2016  . Diabetes ([place]) 12/26/2016    History reviewed. No pertinent surgical history.  OB History    No data available       Home Medications    Prior to Admission medications   Medication Sig Start Date End Date Taking? Authorizing Provider  albuterol (PROVENTIL HFA;VENTOLIN HFA) 108 (90 Base) MCG/ACT inhaler Inhale 1-2 puffs into the lungs every 6 (six) hours as needed for wheezing. 03/16/17   Tanna Furry, MD  beclomethasone (QVAR) 80 MCG/ACT inhaler Inhale 2 puffs into the lungs 2 (two) times daily as needed (asthma).     [provider]  butalbital-acetaminophen-caffeine [employer], ESGIC) [phone] MG  tablet Take 1-2 tablets by mouth every 6 (six) hours as needed for headache. 03/19/17 03/19/18  Clayton Bibles, PA-C  IBU 800 MG tablet Take 800 mg by mouth 3 (three) times daily as needed for pain. 04/02/17   [provider]  losartan (COZAAR) 50 MG tablet Take 50 mg by mouth daily.    [provider]  metFORMIN (GLUCOPHAGE) 1000 MG tablet Take 1,000 mg by mouth daily with breakfast.     [provider]  oxyCODONE (OXY IR/ROXICODONE) 5 MG immediate release tablet Take 1 tablet (5 mg total) by mouth every 6 (six) hours as needed for severe pain. 04/16/17   Clayton Bibles, PA-C  predniSONE (DELTASONE) 20 MG tablet Take 1 tablet (20 mg total) by mouth 2 (two) times daily with a meal. Patient not taking: Reported on 04/16/2017 03/16/17   Tanna Furry, MD  promethazine-dextromethorphan (PROMETHAZINE-DM) 6.25-15 MG/5ML syrup Take 5 mLs by mouth 4 (four) times daily as needed for cough. 03/16/17   Tanna Furry, MD    Family History Family History  Problem Relation Age of Onset  . Hypertension Other   . Diabetes Other     Social History Social History  Substance Use Topics  . Smoking status: Never Smoker  . Smokeless tobacco: Never Used  . Alcohol use No     Allergies   Patient has no known allergies.   Review of Systems Review of Systems  All other systems  reviewed and are negative.    Physical Exam Updated Vital Signs BP 117/80 (BP Location: Left Arm)   Pulse (!) 122   Temp 98.7 F (37.1 C) (Oral)   Resp 16   SpO2 98%   Physical Exam  Nursing note and vitals reviewed.  Ill-appearing and cachectic 46 year old female, resting comfortably and in no acute distress. Vital signs are significant for tachycardia. Oxygen saturation is 98%, which is normal. Head is normocephalic and atraumatic. PERRLA, EOMI. Oropharynx is clear. Sclerae are anicteric. Neck is nontender and supple without adenopathy or JVD. Back is nontender and there is no CVA tenderness. Lungs are clear  without rales, wheezes, or rhonchi. Chest is nontender. Heart has regular rate and rhythm without murmur. Abdomen is protuberant, soft, with mild right upper quadrant tenderness. Liver is massively enlarged and nodular extending all way down into the pelvic brim. There are no other masses. peristalsis is hypoactive. Extremities have no cyanosis or edema, full range of motion is present. Skin is warm and dry without rash. Neurologic: Mental status is normal, cranial nerves are intact, there are no motor or sensory deficits.  ED Treatments / Results  Labs (all labs ordered are listed, but only abnormal results are displayed) Labs Reviewed  COMPREHENSIVE METABOLIC PANEL - Abnormal; Notable for the following:       Result Value   Sodium 124 (*)    Chloride 91 (*)    CO2 19 (*)    Glucose, Bld 214 (*)    Creatinine, Ser 1.13 (*)    Total Protein 8.5 (*)    Albumin 2.0 (*)    AST 120 (*)    ALT 80 (*)    Alkaline Phosphatase 850 (*)    Total Bilirubin 17.4 (*)    GFR calc non Af Amer 57 (*)    All other components within normal limits  CBC - Abnormal; Notable for the following:    WBC 24.8 (*)    RBC 3.68 (*)    Hemoglobin 8.0 (*)    HCT 27.1 (*)    MCV 73.6 (*)    MCH 21.7 (*)    MCHC 29.5 (*)    RDW 20.5 (*)    Platelets 654 (*)    All other components within normal limits  DIFFERENTIAL - Abnormal; Notable for the following:    Neutro Abs 16.9 (*)    Lymphs Abs 6.2 (*)    Monocytes Absolute 1.7 (*)    All other components within normal limits  LIPASE, BLOOD  URINALYSIS, ROUTINE W REFLEX MICROSCOPIC    Radiology Dg Chest 2 View  Result Date: 04/30/2017 CLINICAL DATA:  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 Jaundice. EXAM: CHEST  2 VIEW COMPARISON:  03/16/2017 CXR, chest CT 03/19/2017 FINDINGS: The heart size and mediastinal contours are within normal limits. Both lungs are clear. Chronic mild elevation of right  hemidiaphragm. No acute nor suspicious osseous lesions. Osteophytes noted off the inferior glenoid rim bilaterally. IMPRESSION: No active cardiopulmonary disease. Electronically Signed   By: Ashley Royalty M.D.   On: 04/30/2017 01:11    Procedures Procedures (including critical care time)  Medications Ordered in ED Medications  ondansetron (ZOFRAN-ODT) disintegrating tablet 8 mg (8 mg Oral Given 04/30/17 0038)  oxyCODONE (Oxy IR/ROXICODONE) immediate release tablet 10 mg (10 mg Oral Given 04/30/17 0042)  sodium chloride 0.9 % bolus 1,000 mL (1,000 mLs Intravenous New Bag/Given 04/30/17 0259)  Initial Impression / Assessment and Plan / ED Course  I have reviewed the triage vital signs and the nursing notes.  Pertinent labs & imaging results that were available during my care of the patient were reviewed by me and considered in my medical decision making (see chart for details).  End-stage widely metastatic cancer-presumably of ovarian origin. Old records are reviewed confirming diagnosis of metastatic cancer of her gym and ovarian origin, and patient refusing chemotherapy. She clearly is having progression of her disease. At this point, I will try for palliation. We'll check chest x-ray because of persistent cough. She will be given a higher dose of oxycodone and will be given ondansetron along with it to try to prevent nausea. Patient and her husband do understand that treatment is palliative at this point.  She had reasonably good relief of pain with oxycodone 10 mg, and no nausea when given concurrently with ondansetron. Laboratory workup does show hyponatremia, markedly elevated bilirubin and alkaline phosphatase, mildly elevated AST and ALT. WBC is markedly elevated and is felt to be related to stress and not infection. Hemoglobin is little changed from baseline. She is discharged with prescriptions for oxycodone and ondansetron, follow-up with her PCP and her oncologist.  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   OXYCODONE (ROXICODONE) 5 MG IMMEDIATE RELEASE TABLET    Take 2 tablets (10 mg total) by mouth every 4 (four) hours as needed for severe pain.     Delora Fuel, MD 48/54/62 [phone]

## 2017-04-30 NOTE — Discharge Instructions (Signed)
You may increase the amount of pain medicine you are taking if that is what you need to do to control pain.

## 2017-05-15 ENCOUNTER — Encounter (HOSPITAL_COMMUNITY): Payer: Self-pay

## 2017-05-15 ENCOUNTER — Inpatient Hospital Stay (HOSPITAL_COMMUNITY)
Admission: EM | Admit: 2017-05-15 | Discharge: 2017-05-18 | DRG: 683 | Disposition: A | Discharge status: Home / Self Care | Payer: Medicaid Other | Attending: Internal Medicine | Admitting: Internal Medicine

## 2017-05-15 ENCOUNTER — Emergency Department (HOSPITAL_COMMUNITY): Payer: Medicaid Other

## 2017-05-15 DIAGNOSIS — F17290 Nicotine dependence, other tobacco product, uncomplicated: Secondary | ICD-10-CM | POA: Diagnosis present

## 2017-05-15 DIAGNOSIS — Z7984 Long term (current) use of oral hypoglycemic drugs: Secondary | ICD-10-CM | POA: Diagnosis not present

## 2017-05-15 DIAGNOSIS — R740 Nonspecific elevation of levels of transaminase and lactic acid dehydrogenase [LDH]: Secondary | ICD-10-CM | POA: Diagnosis present

## 2017-05-15 DIAGNOSIS — K219 Gastro-esophageal reflux disease without esophagitis: Secondary | ICD-10-CM | POA: Diagnosis present

## 2017-05-15 DIAGNOSIS — K5903 Drug induced constipation: Secondary | ICD-10-CM | POA: Diagnosis present

## 2017-05-15 DIAGNOSIS — R52 Pain, unspecified: Secondary | ICD-10-CM | POA: Diagnosis present

## 2017-05-15 DIAGNOSIS — C569 Malignant neoplasm of unspecified ovary: Secondary | ICD-10-CM | POA: Diagnosis present

## 2017-05-15 DIAGNOSIS — R630 Anorexia: Secondary | ICD-10-CM | POA: Diagnosis present

## 2017-05-15 DIAGNOSIS — D473 Essential (hemorrhagic) thrombocythemia: Secondary | ICD-10-CM | POA: Diagnosis not present

## 2017-05-15 DIAGNOSIS — Z79899 Other long term (current) drug therapy: Secondary | ICD-10-CM | POA: Diagnosis not present

## 2017-05-15 DIAGNOSIS — Y92009 Unspecified place in unspecified non-institutional (private) residence as the place of occurrence of the external cause: Secondary | ICD-10-CM | POA: Diagnosis not present

## 2017-05-15 DIAGNOSIS — C787 Secondary malignant neoplasm of liver and intrahepatic bile duct: Secondary | ICD-10-CM | POA: Diagnosis present

## 2017-05-15 DIAGNOSIS — D75839 Thrombocytosis, unspecified: Secondary | ICD-10-CM | POA: Diagnosis present

## 2017-05-15 DIAGNOSIS — K5641 Fecal impaction: Secondary | ICD-10-CM | POA: Diagnosis present

## 2017-05-15 DIAGNOSIS — E871 Hypo-osmolality and hyponatremia: Secondary | ICD-10-CM | POA: Diagnosis present

## 2017-05-15 DIAGNOSIS — D509 Iron deficiency anemia, unspecified: Secondary | ICD-10-CM | POA: Diagnosis not present

## 2017-05-15 DIAGNOSIS — Z7951 Long term (current) use of inhaled steroids: Secondary | ICD-10-CM | POA: Diagnosis not present

## 2017-05-15 DIAGNOSIS — R7989 Other specified abnormal findings of blood chemistry: Secondary | ICD-10-CM | POA: Diagnosis present

## 2017-05-15 DIAGNOSIS — T402X5A Adverse effect of other opioids, initial encounter: Secondary | ICD-10-CM | POA: Diagnosis present

## 2017-05-15 DIAGNOSIS — E860 Dehydration: Secondary | ICD-10-CM | POA: Diagnosis present

## 2017-05-15 DIAGNOSIS — D72829 Elevated white blood cell count, unspecified: Secondary | ICD-10-CM | POA: Diagnosis present

## 2017-05-15 DIAGNOSIS — Z7189 Other specified counseling: Secondary | ICD-10-CM | POA: Diagnosis not present

## 2017-05-15 DIAGNOSIS — R7401 Elevation of levels of liver transaminase levels: Secondary | ICD-10-CM

## 2017-05-15 DIAGNOSIS — C579 Malignant neoplasm of female genital organ, unspecified: Secondary | ICD-10-CM

## 2017-05-15 DIAGNOSIS — I10 Essential (primary) hypertension: Secondary | ICD-10-CM | POA: Diagnosis present

## 2017-05-15 DIAGNOSIS — N179 Acute kidney failure, unspecified: Principal | ICD-10-CM | POA: Diagnosis present

## 2017-05-15 DIAGNOSIS — E119 Type 2 diabetes mellitus without complications: Secondary | ICD-10-CM | POA: Diagnosis not present

## 2017-05-15 DIAGNOSIS — Z515 Encounter for palliative care: Secondary | ICD-10-CM | POA: Diagnosis not present

## 2017-05-15 DIAGNOSIS — K5900 Constipation, unspecified: Secondary | ICD-10-CM | POA: Diagnosis present

## 2017-05-15 LAB — CBC WITH DIFFERENTIAL/PLATELET
BASOS ABS: 0 10*3/uL (ref 0.0–0.1)
BASOS PCT: 0 %
Eosinophils Absolute: 0.2 10*3/uL (ref 0.0–0.7)
Eosinophils Relative: 1 %
HEMATOCRIT: 26.8 % — AB (ref 36.0–46.0)
Hemoglobin: 8.7 g/dL — ABNORMAL LOW (ref 12.0–15.0)
LYMPHS ABS: 2.4 10*3/uL (ref 0.7–4.0)
Lymphocytes Relative: 13 %
MCH: 24.1 pg — AB (ref 26.0–34.0)
MCHC: 32.5 g/dL (ref 30.0–36.0)
MCV: 74.2 fL — AB (ref 78.0–100.0)
MONOS PCT: 10 %
Monocytes Absolute: 1.8 10*3/uL — ABNORMAL HIGH (ref 0.1–1.0)
NEUTROS ABS: 13.7 10*3/uL — AB (ref 1.7–7.7)
Neutrophils Relative %: 76 %
Platelets: 742 10*3/uL — ABNORMAL HIGH (ref 150–400)
RBC: 3.61 MIL/uL — ABNORMAL LOW (ref 3.87–5.11)
RDW: 21.4 % — AB (ref 11.5–15.5)
WBC: 18.1 10*3/uL — ABNORMAL HIGH (ref 4.0–10.5)

## 2017-05-15 LAB — COMPREHENSIVE METABOLIC PANEL
ALK PHOS: 628 U/L — AB (ref 38–126)
ALT: 46 U/L (ref 14–54)
ANION GAP: 13 (ref 5–15)
AST: 109 U/L — ABNORMAL HIGH (ref 15–41)
Albumin: 2.1 g/dL — ABNORMAL LOW (ref 3.5–5.0)
BILIRUBIN TOTAL: 13.9 mg/dL — AB (ref 0.3–1.2)
BUN: 42 mg/dL — AB (ref 6–20)
CHLORIDE: 103 mmol/L (ref 101–111)
CO2: 18 mmol/L — AB (ref 22–32)
CREATININE: 2.31 mg/dL — AB (ref 0.44–1.00)
Calcium: 9.6 mg/dL (ref 8.9–10.3)
GFR, EST AFRICAN AMERICAN: 28 mL/min — AB (ref >60)
GFR, EST NON AFRICAN AMERICAN: 24 mL/min — AB (ref >60)
GLUCOSE: 80 mg/dL (ref 65–99)
Potassium: 5.1 mmol/L (ref 3.5–5.1)
Sodium: 134 mmol/L — ABNORMAL LOW (ref 135–145)
Total Protein: 7.2 g/dL (ref 6.5–8.1)

## 2017-05-15 LAB — I-STAT CHEM 8, ED
BUN: 42 mg/dL — AB (ref 6–20)
CALCIUM ION: 1.15 mmol/L (ref 1.15–1.40)
CREATININE: 2.7 mg/dL — AB (ref 0.44–1.00)
Chloride: 108 mmol/L (ref 101–111)
GLUCOSE: 80 mg/dL (ref 65–99)
HCT: 26 % — ABNORMAL LOW (ref 36.0–46.0)
Hemoglobin: 8.8 g/dL — ABNORMAL LOW (ref 12.0–15.0)
Potassium: 5.2 mmol/L — ABNORMAL HIGH (ref 3.5–5.1)
SODIUM: 137 mmol/L (ref 135–145)
TCO2: 20 mmol/L (ref 0–100)

## 2017-05-15 LAB — LIPASE, BLOOD: Lipase: 82 U/L — ABNORMAL HIGH (ref 11–51)

## 2017-05-15 MED ORDER — SORBITOL 70 % SOLN
30.0000 mL | Freq: Every day | Status: DC | PRN
  Administered 2017-05-16: 30 mL via ORAL
  Filled 2017-05-15: qty 30

## 2017-05-15 MED ORDER — MORPHINE SULFATE (PF) 2 MG/ML IV SOLN
4.0000 mg | Freq: Once | INTRAVENOUS | Status: AC
  Administered 2017-05-15: 4 mg via INTRAVENOUS
  Filled 2017-05-15: qty 2

## 2017-05-15 MED ORDER — ACETAMINOPHEN 325 MG PO TABS: 650.0000 mg | ORAL_TABLET | Freq: Four times a day (QID) | ORAL | Status: DC | PRN

## 2017-05-15 MED ORDER — HEPARIN SODIUM (PORCINE) 5000 UNIT/ML IJ SOLN
5000.0000 [IU] | Freq: Three times a day (TID) | INTRAMUSCULAR | Status: DC
  Administered 2017-05-16 – 2017-05-17 (×3): 5000 [IU] via SUBCUTANEOUS
  Filled 2017-05-15 (×4): qty 1

## 2017-05-15 MED ORDER — MAGNESIUM CITRATE PO SOLN: 1.0000 | Freq: Once | ORAL | 0 refills | Status: AC

## 2017-05-15 MED ORDER — SODIUM CHLORIDE 0.9 % IV SOLN
INTRAVENOUS | Status: AC
  Administered 2017-05-16: via INTRAVENOUS

## 2017-05-15 MED ORDER — LIDOCAINE HCL 2 % EX GEL
1.0000 "application " | Freq: Once | CUTANEOUS | Status: DC
  Filled 2017-05-15: qty 11

## 2017-05-15 MED ORDER — IOPAMIDOL (ISOVUE-300) INJECTION 61%
INTRAVENOUS | Status: AC
  Filled 2017-05-15: qty 100

## 2017-05-15 MED ORDER — LIDOCAINE 2 % EX GEL: 1.0000 "application " | Freq: Two times a day (BID) | CUTANEOUS | 0 refills | Status: AC | PRN

## 2017-05-15 MED ORDER — ALBUTEROL SULFATE (2.5 MG/3ML) 0.083% IN NEBU: 2.5000 mg | INHALATION_SOLUTION | Freq: Four times a day (QID) | RESPIRATORY_TRACT | Status: DC | PRN

## 2017-05-15 MED ORDER — OXYCODONE HCL 5 MG PO TABS
10.0000 mg | ORAL_TABLET | ORAL | Status: DC | PRN
  Administered 2017-05-17: 10 mg via ORAL
  Filled 2017-05-15: qty 2

## 2017-05-15 MED ORDER — ONDANSETRON HCL 4 MG PO TABS: 4.0000 mg | ORAL_TABLET | Freq: Four times a day (QID) | ORAL | Status: DC | PRN

## 2017-05-15 MED ORDER — ONDANSETRON HCL 4 MG/2ML IJ SOLN
4.0000 mg | Freq: Four times a day (QID) | INTRAMUSCULAR | Status: DC | PRN
  Administered 2017-05-17: 4 mg via INTRAVENOUS
  Filled 2017-05-15: qty 2

## 2017-05-15 MED ORDER — SODIUM CHLORIDE 0.9 % IV BOLUS (SEPSIS)
1000.0000 mL | Freq: Once | INTRAVENOUS | Status: AC
  Administered 2017-05-15: 1000 mL via INTRAVENOUS

## 2017-05-15 MED ORDER — MAGNESIUM HYDROXIDE 400 MG/5ML PO SUSP: 30.0000 mL | Freq: Every day | ORAL | Status: DC | PRN

## 2017-05-15 MED ORDER — DOCUSATE SODIUM 100 MG PO CAPS: 100.0000 mg | ORAL_CAPSULE | Freq: Two times a day (BID) | ORAL | 0 refills | Status: AC

## 2017-05-15 MED ORDER — ACETAMINOPHEN 650 MG RE SUPP: 650.0000 mg | Freq: Four times a day (QID) | RECTAL | Status: DC | PRN

## 2017-05-15 MED ORDER — MAGNESIUM CITRATE PO SOLN: 1.0000 | Freq: Once | ORAL | Status: DC | PRN

## 2017-05-15 MED ORDER — BUDESONIDE 0.5 MG/2ML IN SUSP: 2.0000 mL | Freq: Two times a day (BID) | RESPIRATORY_TRACT | Status: DC | PRN

## 2017-05-15 NOTE — ED Provider Notes (Signed)
Hayley Collins   CSN: [id number] Arrival date & time: 05/15/17  1101     History   Chief Complaint Chief Complaint  Patient presents with  . Constipation    HPI Hayley Collins is a 46 y.o. female.  HPI   46 year old female presents with concern for constipation. Patient reports that she's not had a bowel movement in 2-3 weeks. Reports she is passing flatus. Has occasional nausea and vomiting over the last several months. Denies fevers, urinary symptoms. Reports waxing and waning abdominal pain over months which is mild at this time, however has severe rectal pain. Reports severe rectal pain when trying to have BM.  Has ovarian cancer, and chose "natural route" instead of chemotherapy and surgery.  Taking prune juice for constipation without relief. Has not tried other treatments.   Past Medical History:  Diagnosis Date  . Cancer ([place])   . Diabetes mellitus   . Fatty liver   . GERD (gastroesophageal reflux disease)   . Hypertension     Patient Active Problem List   Diagnosis Date Noted  . 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 Gynecologic malignancy ([place]) 01/16/2017  . Ovarian mass, right 12/26/2016  . Enlarged uterus 12/26/2016  . Liver masses 12/26/2016  . Diabetes ([place]) 12/26/2016    Past Surgical History:  Procedure Laterality Date  . DENTAL SURGERY    . FOOT SURGERY      OB History    No data available       Home Medications    Prior to Admission medications   Medication Sig Start Date End Date Taking? Authorizing Provider  albuterol (PROVENTIL HFA;VENTOLIN HFA) 108 (90 Base) MCG/ACT inhaler Inhale 1-2 puffs into the lungs every 6 (six) hours as needed for wheezing. 03/16/17  Yes Tanna Furry, MD  beclomethasone (QVAR) 80 MCG/ACT inhaler Inhale 2 puffs into the lungs 2 (two) times daily as needed (asthma).    Yes [provider]  losartan (COZAAR) 50 MG tablet Take 50 mg by mouth daily.   Yes [provider]  metFORMIN (GLUCOPHAGE) 1000 MG tablet Take 1,000 mg by mouth daily with breakfast.    Yes [provider]  ondansetron (ZOFRAN) 4 MG tablet Take 1 tablet (4 mg total) by mouth every 6 (six) hours. 3/79/02  Yes Delora Fuel, MD  oxyCODONE (ROXICODONE) 5 MG immediate release tablet Take 2 tablets (10 mg total) by mouth every 4 (four) hours as needed for severe pain. 01/23/72  Yes Delora Fuel, MD  docusate sodium (COLACE) 100 MG capsule Take 1 capsule (100 mg total) by mouth 2 (two) times daily. 05/15/17   Gareth Morgan, MD  Lidocaine 2 % GEL Apply 1 application topically 2 (two) times daily as needed (bowel movement pain). 05/15/17   Gareth Morgan, MD  magnesium citrate SOLN Take 296 mLs (1 Bottle total) by mouth once. 05/15/17 05/15/17  Gareth Morgan, MD    Family History Family History  Problem Relation Age of Onset  . Hypertension Other   . Diabetes Other     Social History Social History  Substance Use Topics  . Smoking status: Current Every Day Smoker    Types: E-cigarettes  . Smokeless tobacco: Never Used  . Alcohol use No     Allergies   Patient has no known allergies.   Review of Systems Review of Systems  Constitutional: Positive for fatigue. Negative for fever.  HENT: Negative for sore throat.   Eyes: Negative for visual disturbance.  Respiratory: Negative for cough and shortness of breath.   Cardiovascular: Negative for chest pain.  Gastrointestinal: Positive for abdominal pain (mild), blood in stool (some bright red blood), constipation, nausea and vomiting (waxing and waning for months). Negative for diarrhea.  Genitourinary: Negative for difficulty urinating and dysuria.  Musculoskeletal: Negative for back pain and neck pain.  Skin: Negative for rash.  Neurological: Negative for syncope and headaches.     Physical Exam Updated Vital Signs BP 120/65 (BP Location: Left Arm)   Pulse (!) 102   Temp 98.2 F (36.8 C) (Oral)   Resp  18   Ht 5\' 6"  (1.676 m)   Wt 72.6 kg (160 lb)   LMP 04/14/2017   SpO2 100%   BMI 25.82 kg/m   Physical Exam  Constitutional: She is oriented to person, place, and time. She appears well-developed and well-nourished. She appears cachectic. No distress.  HENT:  Head: Normocephalic and atraumatic.  Eyes: Conjunctivae and EOM are normal. Scleral icterus is present.  Neck: Normal range of motion.  Cardiovascular: Normal rate, regular rhythm, normal heart sounds and intact distal pulses.  Exam reveals no gallop and no friction rub.   No murmur heard. Pulmonary/Chest: Effort normal and breath sounds normal. No respiratory distress. She has no wheezes. She has no rales.  Abdominal: Soft. She exhibits no distension. There is no tenderness. There is no guarding.  Genitourinary: Rectum normal. Rectal exam shows anal tone normal.  Musculoskeletal: She exhibits no edema or tenderness.  Neurological: She is alert and oriented to person, place, and time.  Skin: Skin is warm and dry. No rash noted. She is not diaphoretic. No erythema.  Nursing Collins and vitals reviewed.    ED Treatments / Results  Labs (all labs ordered are listed, but only abnormal results are displayed) Labs Reviewed  CBC WITH DIFFERENTIAL/PLATELET - Abnormal; Notable for the following:       Result Value   WBC 18.1 (*)    RBC 3.61 (*)    Hemoglobin 8.7 (*)    HCT 26.8 (*)    MCV 74.2 (*)    MCH 24.1 (*)    RDW 21.4 (*)    Platelets 742 (*)    Neutro Abs 13.7 (*)    Monocytes Absolute 1.8 (*)    All other components within normal limits  LIPASE, BLOOD - Abnormal; Notable for the following:    Lipase 82 (*)    All other components within normal limits  COMPREHENSIVE METABOLIC PANEL - Abnormal; Notable for the following:    Sodium 134 (*)    CO2 18 (*)    BUN 42 (*)    Creatinine, Ser 2.31 (*)    Albumin 2.1 (*)    AST 109 (*)    Alkaline Phosphatase 628 (*)    Total Bilirubin 13.9 (*)    GFR calc non Af Amer 24  (*)    GFR calc Af Amer 28 (*)    All other components within normal limits  I-STAT CHEM 8, ED - Abnormal; Notable for the following:    Potassium 5.2 (*)    BUN 42 (*)    Creatinine, Ser 2.70 (*)    Hemoglobin 8.8 (*)    HCT 26.0 (*)    All other components within normal limits    EKG  EKG Interpretation None       Radiology Ct Abdomen Pelvis Wo Contrast  Result Date: 05/15/2017 CLINICAL DATA:  46 y/o female with metastatic adenocarcinoma, GYN origin  suspected on pathology analysis of liver biopsy. Pain with no bowel movement for 3 weeks. Jaundice. Renal insufficiency. EXAM: CT ABDOMEN AND PELVIS WITHOUT CONTRAST TECHNIQUE: Multidetector CT imaging of the abdomen and pelvis was performed following the standard protocol without IV contrast. COMPARISON:  CT Abdomen and Pelvis 04/16/2017, and earlier FINDINGS: Lower chest: Lung bases are stable an negative. No pericardial or pleural effusion. Hepatobiliary: Moderate to severe hepatomegaly (liver greater than 33 cm in length) with widespread hepatic metastases which were better demonstrated with the use of IV contrast on the prior studies. Trace perihepatic free fluid along the right lobe (series 2, image 52). Grossly negative gallbladder. Pancreas: No biliary ductal enlargement is evident. Negative noncontrast pancreas. Spleen: Negative noncontrast spleen. Adrenals/Urinary Tract: Negative noncontrast adrenal glands and kidneys. No hydronephrosis. No hydroureter is evident, although the course of both ureters is difficult to delineate. Moderately dilated but otherwise unremarkable urinary bladder. Stomach/Bowel: Moderate volume of retained stool in the colon. Regional mass effect including on the bowel from the enlarged liver and uterus/adnexa. This is most pronounced at the hepatic flexure where the colon is relatively compressed between the liver and kidney (series 2, image 55). No dilated small bowel. Negative stomach. No definite mesenteric  free fluid. No free air. Vascular/Lymphatic: Vascular patency is not evaluated in the absence of IV contrast. No lymphadenopathy is evident in the noncontrast abdomen or pelvis. Reproductive: Lobulated uterine and right adnexal region soft tissue masses re- demonstrated. Noncontrast appearance is stable compared to 04/16/2017. See series 2, image 66 and 74. There is some regional mass effect noted, including on the sigmoid colon, although this is relatively mild. No distal large bowel compression is identified. Other: No pelvic free fluid. Musculoskeletal: No acute or suspicious osseous lesion identified. IMPRESSION: 1. Moderate to severe hepatomegaly with extensive liver metastases. Lobulated uterine and right adnexal enlargement appear stable since 04/16/2017. 2. Up to moderate retained stool in the colon but no evidence of mechanical bowel obstruction, although there is some mass effect on the large bowel related to #1 - most pronounced at the hepatic flexure. 3. No new metastatic disease identified in the absence of IV contrast. Electronically Signed   By: Genevie Ann M.D.   On: 05/15/2017 21:13    Procedures Fecal disimpaction Date/Time: 05/15/2017 11:09 PM Performed by: Gareth Morgan Authorized by: Gareth Morgan  Consent: Verbal consent obtained. Risks and benefits: risks, benefits and alternatives were discussed Required items: required blood products, implants, devices, and special equipment available Patient identity confirmed: verbally with patient Time out: Immediately prior to procedure a "time out" was called to verify the correct patient, procedure, equipment, support staff and site/side marked as required. Preparation: Patient was prepped and draped in the usual sterile fashion. Patient tolerance: Patient tolerated the procedure well with no immediate complications    (including critical care time)  Medications Ordered in ED Medications  iopamidol (ISOVUE-300) 61 % injection (not  administered)  lidocaine (XYLOCAINE) 2 % jelly 1 application (not administered)  sodium chloride 0.9 % bolus 1,000 mL (0 mLs Intravenous Stopped 05/15/17 1533)  morphine 2 MG/ML injection 4 mg (4 mg Intravenous Given 05/15/17 1417)  sodium chloride 0.9 % bolus 1,000 mL (1,000 mLs Intravenous New Bag/Given 05/15/17 2219)     Initial Impression / Assessment and Plan / ED Course  I have reviewed the triage vital signs and the nursing notes.  Pertinent labs & imaging results that were available during my care of the patient were reviewed by me and considered in  my medical decision making (see chart for details).    46yo female with history of ovarian cancer not on chemotherapy presents with concern for constipation, rectal pain.  Suspect likely opiate induced constipation, however given hx of ovarian cancer, concern for worsening, mechanical obstruction also a possibility.  Discussed options with patient and family, including focus on comfort with disimpaction, constipation treatments and discharge versus evaluation with imaging to establish any other complications, signs of mechanical obstruction. Patient reports she would like imaging, further evaluation, would consider further treatment/surgery.  Patient with chronic leukocytosis. Performed disimpaction. Other labs and CT pending at time of transfer of care.  Final Clinical Impressions(s) / ED Diagnoses   Final diagnoses:  Drug-induced constipation    New Prescriptions New Prescriptions   DOCUSATE SODIUM (COLACE) 100 MG CAPSULE    Take 1 capsule (100 mg total) by mouth 2 (two) times daily.   LIDOCAINE 2 % GEL    Apply 1 application topically 2 (two) times daily as needed (bowel movement pain).   MAGNESIUM CITRATE SOLN    Take 296 mLs (1 Bottle total) by mouth once.     Gareth Morgan, MD 05/15/17 [phone]

## 2017-05-15 NOTE — Discharge Instructions (Signed)
Take the magnesium citrate prescribed, if that does not work: take 4-6 caps of miralax in one 32oz bottle of gatorade on one day, followed by 2 caps per day for 2 days, followed by 1 cap a day until you have daily soft stool.  Take daily docusate as prescribed.

## 2017-05-15 NOTE — ED Provider Notes (Signed)
9:54 PM Assumed care from Dr. Billy Fischer, please see their note for full history, physical and decision making until this point. In brief this is a 46 y.o. year old female who presented to the ED tonight with Constipation  46 year old female with metastatic ovarian cancer here with constipation likely secondary to dehydration and opiate use. Despite the by previous provider but still with some pain. Also with new worsening renal function and no obvious changes on CT scan. Once again discussed with her whether or not she was going to be doing chemotherapy or not and if not she needed to be doing palliative care. Even at this time she adamantly refuses chemotherapy thickening minimal work and make her feel worse. However the plan will be to fluid hydrate give her pain control admitted to the hospital for her AKI. Palliative care consulted.      Labs, studies and imaging reviewed by myself and considered in medical decision making if ordered. Imaging interpreted by radiology.  Labs Reviewed  CBC WITH DIFFERENTIAL/PLATELET - Abnormal; Notable for the following:       Result Value   WBC 18.1 (*)    RBC 3.61 (*)    Hemoglobin 8.7 (*)    HCT 26.8 (*)    MCV 74.2 (*)    MCH 24.1 (*)    RDW 21.4 (*)    Platelets 742 (*)    Neutro Abs 13.7 (*)    Monocytes Absolute 1.8 (*)    All other components within normal limits  LIPASE, BLOOD - Abnormal; Notable for the following:    Lipase 82 (*)    All other components within normal limits  COMPREHENSIVE METABOLIC PANEL - Abnormal; Notable for the following:    Sodium 134 (*)    CO2 18 (*)    BUN 42 (*)    Creatinine, Ser 2.31 (*)    Albumin 2.1 (*)    AST 109 (*)    Alkaline Phosphatase 628 (*)    Total Bilirubin 13.9 (*)    GFR calc non Af Amer 24 (*)    GFR calc Af Amer 28 (*)    All other components within normal limits  I-STAT CHEM 8, ED - Abnormal; Notable for the following:    Potassium 5.2 (*)    BUN 42 (*)    Creatinine, Ser 2.70 (*)    Hemoglobin 8.8 (*)    HCT 26.0 (*)    All other components within normal limits    CT ABDOMEN PELVIS WO CONTRAST  Final Result      No Follow-up on file.    Jesiah Yerby, Corene Cornea, MD 05/16/17 [phone]

## 2017-05-15 NOTE — ED Notes (Addendum)
Lavender and Light green recollected

## 2017-05-15 NOTE — ED Triage Notes (Signed)
Per EMS- Patient c/o no BM x 3 weeks. Patient also has ovarian cancer and is receiving no treatment.

## 2017-05-15 NOTE — ED Notes (Signed)
Lab has called and said the third draw of blood has hemolyzed.

## 2017-05-15 NOTE — H&P (Signed)
History and Physical    Hayley Collins FYB:[id number] DOB: 1971/01/21 DOA: 05/15/2017  PCP: Nolene Ebbs, MD   Patient coming from: Home  Chief Complaint: Constipation   HPI: Hayley Collins is a 46 y.o. female with medical history significant for hypertension, type 2 diabetes mellitus, and ovarian cancer with metastases, now presenting to the emergency department for evaluation of constipation. Patient reports that she has not had a bowel movement in the last 2-3 weeks, though she continues to pass flatus. She reports constant abdominal pain for over a month now. She reports eating and drinking very little due to no appetite. She also reports decreased urination. Denies dysuria. Denies headache, change in vision or hearing, or focal numbness or weakness. Denies any melena or hematochezia. She has been drinking prune juice at home with no appreciable effect. She was evaluated by Andria Frames in April 2018, was told that the cancer was to advance for surgery, but chemotherapy was recommended. Patient refused any treatment for this, but also refused a hospice referral at that time and insisted on keeping her CODE STATUS as full.  ED Course: Upon arrival to the ED, patient is found to be afebrile, saturating well on room air, and with vital signs stable. Patient was disimpacted in the emergency department. CT of the abdomen and pelvis was performed which demonstrates moderate to severe hepatomegaly with extensive liver metastases and a stable appearance to her lobulated uterine and right adnexal enlargement compared to the study 1 month ago. Chemistry panel features a mild hyponatremia, BUN 42, serum creatinine 2.31, alkaline phosphatase 678, AST 104, and total bilirubin of 13.9. CBC features a chronic leukocytosis to 18,100 and a stable microcytic anemia with hemoglobin of 8.7. Thrombocytosis is noted to 742,000. Patient was given 2 L normal saline in the ED, 4 mg morphine, and an enema. She remained  medically stable and in no apparent respiratory distress, and she will be admitted to the medical surgical unit for ongoing evaluation and management of acute kidney injury and opiate-induced constipation.  Review of Systems:  All other systems reviewed and apart from HPI, are negative.  Past Medical History:  Diagnosis Date  . Cancer ([place])   . Diabetes mellitus   . Fatty liver   . GERD (gastroesophageal reflux disease)   . Hypertension     Past Surgical History:  Procedure Laterality Date  . DENTAL SURGERY    . FOOT SURGERY       reports that she has been smoking E-cigarettes.  She has never used smokeless tobacco. She reports that she does not drink alcohol or use drugs.  No Known Allergies  Family History  Problem Relation Age of Onset  . Hypertension Other   . Diabetes Other      Prior to Admission medications   Medication Sig Start Date End Date Taking? Authorizing Provider  albuterol (PROVENTIL HFA;VENTOLIN HFA) 108 (90 Base) MCG/ACT inhaler Inhale 1-2 puffs into the lungs every 6 (six) hours as needed for wheezing. 03/16/17  Yes Tanna Furry, MD  beclomethasone (QVAR) 80 MCG/ACT inhaler Inhale 2 puffs into the lungs 2 (two) times daily as needed (asthma).    Yes [provider]  losartan (COZAAR) 50 MG tablet Take 50 mg by mouth daily.   Yes [provider]  metFORMIN (GLUCOPHAGE) 1000 MG tablet Take 1,000 mg by mouth daily with breakfast.    Yes [provider]  ondansetron (ZOFRAN) 4 MG tablet Take 1 tablet (4 mg total) by mouth every  6 (six) hours. 0/27/25  Yes Delora Fuel, MD  oxyCODONE (ROXICODONE) 5 MG immediate release tablet Take 2 tablets (10 mg total) by mouth every 4 (four) hours as needed for severe pain. 3/66/44  Yes Delora Fuel, MD  docusate sodium (COLACE) 100 MG capsule Take 1 capsule (100 mg total) by mouth 2 (two) times daily. 05/15/17   Gareth Morgan, MD  Lidocaine 2 % GEL Apply 1 application topically 2 (two) times daily  as needed (bowel movement pain). 05/15/17   Gareth Morgan, MD  magnesium citrate SOLN Take 296 mLs (1 Bottle total) by mouth once. 05/15/17 05/15/17  Gareth Morgan, MD    Physical Exam: Vitals:   05/15/17 1600 05/15/17 1930 05/15/17 2001 05/15/17 2242  BP: 116/63 133/77 (!) 99/51 120/65  Pulse: 90 93 86 (!) 102  Resp: 17  18 18   Temp:      TempSrc:      SpO2: 100% 100% 100% 100%  Weight:      Height:          Constitutional: No respiratory distress. In obvious discomfort. Cachectic.  Eyes: PERTLA, lids normal. Scleral icterus.  ENMT: Mucous membranes are moist. Posterior pharynx clear of any exudate or lesions.   Neck: normal, supple, no masses, no thyromegaly Respiratory: clear to auscultation bilaterally, no wheezing, no crackles. Normal respiratory effort.  Cardiovascular: S1 & S2 heard, regular rate and rhythm. No significant JVD. Abdomen: Distended, diffusely tender without rebound pain or guarding. Bowel sounds active.  Musculoskeletal: no clubbing / cyanosis. No joint deformity upper and lower extremities.   Skin: Chest jaundiced with scattered hyperpigmented papules and excoriations. Poor turgor.  Neurologic: CN 2-12 grossly intact. Sensation intact, DTR normal. Strength 5/5 in all 4 limbs.  Psychiatric: Alert and oriented x 3. Calm, cooperative.     Labs on Admission: I have personally reviewed following labs and imaging studies  CBC:  Recent Labs Lab 05/15/17 1318 05/15/17 2015  WBC 18.1*  --   NEUTROABS 13.7*  --   HGB 8.7* 8.8*  HCT 26.8* 26.0*  MCV 74.2*  --   PLT 742*  --    Basic Metabolic Panel:  Recent Labs Lab 05/15/17 2000 05/15/17 2015  NA 134* 137  K 5.1 5.2*  CL 103 108  CO2 18*  --   GLUCOSE 80 80  BUN 42* 42*  CREATININE 2.31* 2.70*  CALCIUM 9.6  --    GFR: Estimated Creatinine Clearance: 26 mL/min (A) (by C-G formula based on SCr of 2.7 mg/dL (H)). Liver Function Tests:  Recent Labs Lab 05/15/17 2000  AST 109*  ALT 46    ALKPHOS 628*  BILITOT 13.9*  PROT 7.2  ALBUMIN 2.1*    Recent Labs Lab 05/15/17 1447  LIPASE 82*   No results for input(s): AMMONIA in the last 168 hours. Coagulation Profile: No results for input(s): INR, PROTIME in the last 168 hours. Cardiac Enzymes: No results for input(s): CKTOTAL, CKMB, CKMBINDEX, TROPONINI in the last 168 hours. BNP (last 3 results) No results for input(s): PROBNP in the last 8760 hours. HbA1C: No results for input(s): HGBA1C in the last 72 hours. CBG: No results for input(s): GLUCAP in the last 168 hours. Lipid Profile: No results for input(s): CHOL, HDL, LDLCALC, TRIG, CHOLHDL, LDLDIRECT in the last 72 hours. Thyroid Function Tests: No results for input(s): TSH, T4TOTAL, FREET4, T3FREE, THYROIDAB in the last 72 hours. Anemia Panel: No results for input(s): VITAMINB12, FOLATE, FERRITIN, TIBC, IRON, RETICCTPCT in the last 72 hours. Urine  analysis:    Component Value Date/Time   COLORURINE AMBER (A) 04/16/2017 1245   APPEARANCEUR CLEAR 04/16/2017 1245   LABSPEC 1.023 04/16/2017 1245   PHURINE 5.0 04/16/2017 1245   GLUCOSEU 50 (A) 04/16/2017 1245   HGBUR NEGATIVE 04/16/2017 1245   BILIRUBINUR MODERATE (A) 04/16/2017 1245   KETONESUR NEGATIVE 04/16/2017 1245   PROTEINUR NEGATIVE 04/16/2017 1245   UROBILINOGEN 0.2 05/10/2013 1631   NITRITE NEGATIVE 04/16/2017 1245   LEUKOCYTESUR NEGATIVE 04/16/2017 1245   Sepsis Labs: @LABRCNTIP (procalcitonin:4,lacticidven:4) )No results found for this or any previous visit (from the past 240 hour(s)).   Radiological Exams on Admission: Ct Abdomen Pelvis Wo Contrast  Result Date: 05/15/2017 CLINICAL DATA:  46 y/o female with metastatic adenocarcinoma, GYN origin suspected on pathology analysis of liver biopsy. Pain with no bowel movement for 3 weeks. Jaundice. Renal insufficiency. EXAM: CT ABDOMEN AND PELVIS WITHOUT CONTRAST TECHNIQUE: Multidetector CT imaging of the abdomen and pelvis was performed following  the standard protocol without IV contrast. COMPARISON:  CT Abdomen and Pelvis 04/16/2017, and earlier FINDINGS: Lower chest: Lung bases are stable an negative. No pericardial or pleural effusion. Hepatobiliary: Moderate to severe hepatomegaly (liver greater than 33 cm in length) with widespread hepatic metastases which were better demonstrated with the use of IV contrast on the prior studies. Trace perihepatic free fluid along the right lobe (series 2, image 52). Grossly negative gallbladder. Pancreas: No biliary ductal enlargement is evident. Negative noncontrast pancreas. Spleen: Negative noncontrast spleen. Adrenals/Urinary Tract: Negative noncontrast adrenal glands and kidneys. No hydronephrosis. No hydroureter is evident, although the course of both ureters is difficult to delineate. Moderately dilated but otherwise unremarkable urinary bladder. Stomach/Bowel: Moderate volume of retained stool in the colon. Regional mass effect including on the bowel from the enlarged liver and uterus/adnexa. This is most pronounced at the hepatic flexure where the colon is relatively compressed between the liver and kidney (series 2, image 55). No dilated small bowel. Negative stomach. No definite mesenteric free fluid. No free air. Vascular/Lymphatic: Vascular patency is not evaluated in the absence of IV contrast. No lymphadenopathy is evident in the noncontrast abdomen or pelvis. Reproductive: Lobulated uterine and right adnexal region soft tissue masses re- demonstrated. Noncontrast appearance is stable compared to 04/16/2017. See series 2, image 66 and 74. There is some regional mass effect noted, including on the sigmoid colon, although this is relatively mild. No distal large bowel compression is identified. Other: No pelvic free fluid. Musculoskeletal: No acute or suspicious osseous lesion identified. IMPRESSION: 1. Moderate to severe hepatomegaly with extensive liver metastases. Lobulated uterine and right adnexal  enlargement appear stable since 04/16/2017. 2. Up to moderate retained stool in the colon but no evidence of mechanical bowel obstruction, although there is some mass effect on the large bowel related to #1 - most pronounced at the hepatic flexure. 3. No new metastatic disease identified in the absence of IV contrast. Electronically Signed   By: Genevie Ann M.D.   On: 05/15/2017 21:13    EKG: Not performed.   Assessment/Plan  1. Acute kidney injury  - SCr is 2.31 on admission, up from 0.6 earlier in the month  - Likely a prerenal azotemia given her anorexia and clinical dehydration  - No obstruction identified on CT abd/pevis  - She was given 2 liters NS in ED and is continued on IVF hydration  - Hold losartan, repeat chem panel in am    2. Ovarian cancer with metastases - Pt has elected for no treatment  -  Disease advancing as expected  - In April '18, she was given and estimated 6 months to live without treatment per gyn onc  - She agreed to meet with palliative care and consultation has been requested   3. Constipation  - Relieved in ED with manual disimpaction and enema  - Likely secondary to opiates, possibly compression by metastases - Continue bowel regimen   4. Hypertension  - BP is at goal  - Managed with losartan at home, held in light of AKI  - Monitor and treat prn    5. Type II DM  - A1c was 10.8% in March '18  - Managed with metformin at home - Check CBG's with meals and qHS - Start a low-intensity Novolog sliding-scale    6. Microcytic anemia, thrombocytosis  - Hgb is 8.7 and stable with no bleeding evident  - Platelets elevated to 742,000, likely secondary to her metastatic disease   7. Elevated LFT's   - Stable from 2 wks earlier   - Secondary to extensive liver mets    DVT prophylaxis: sq heparin  Code Status: Full  Family Communication: Husband updated at bedside Disposition Plan: Admit to med-surg Consults called: None Admission status: Inpatient      Vianne Bulls, MD [facility] Pager [phone]  If 7PM-7AM, please contact night-coverage [email or web address] Password [facility]  05/15/2017, 11:43 PM

## 2017-05-16 DIAGNOSIS — Z515 Encounter for palliative care: Secondary | ICD-10-CM

## 2017-05-16 DIAGNOSIS — Z7189 Other specified counseling: Secondary | ICD-10-CM

## 2017-05-16 LAB — CBC WITH DIFFERENTIAL/PLATELET
BASOS ABS: 0 10*3/uL (ref 0.0–0.1)
Basophils Relative: 0 %
Eosinophils Absolute: 0.1 10*3/uL (ref 0.0–0.7)
Eosinophils Relative: 1 %
HEMATOCRIT: 26.8 % — AB (ref 36.0–46.0)
Hemoglobin: 8.4 g/dL — ABNORMAL LOW (ref 12.0–15.0)
LYMPHS ABS: 2 10*3/uL (ref 0.7–4.0)
LYMPHS PCT: 14 %
MCH: 23.8 pg — ABNORMAL LOW (ref 26.0–34.0)
MCHC: 31.3 g/dL (ref 30.0–36.0)
MCV: 75.9 fL — AB (ref 78.0–100.0)
MONO ABS: 1.6 10*3/uL — AB (ref 0.1–1.0)
Monocytes Relative: 11 %
NEUTROS ABS: 11.1 10*3/uL — AB (ref 1.7–7.7)
Neutrophils Relative %: 74 %
Platelets: 418 10*3/uL — ABNORMAL HIGH (ref 150–400)
RBC: 3.53 MIL/uL — AB (ref 3.87–5.11)
RDW: 19.1 % — AB (ref 11.5–15.5)
WBC: 14.8 10*3/uL — ABNORMAL HIGH (ref 4.0–10.5)

## 2017-05-16 LAB — COMPREHENSIVE METABOLIC PANEL
ALBUMIN: 2 g/dL — AB (ref 3.5–5.0)
ALT: 43 U/L (ref 14–54)
ANION GAP: 14 (ref 5–15)
AST: 112 U/L — ABNORMAL HIGH (ref 15–41)
Alkaline Phosphatase: 627 U/L — ABNORMAL HIGH (ref 38–126)
BILIRUBIN TOTAL: 14.3 mg/dL — AB (ref 0.3–1.2)
BUN: 37 mg/dL — ABNORMAL HIGH (ref 6–20)
CHLORIDE: 103 mmol/L (ref 101–111)
CO2: 18 mmol/L — ABNORMAL LOW (ref 22–32)
Calcium: 9.5 mg/dL (ref 8.9–10.3)
Creatinine, Ser: 2.06 mg/dL — ABNORMAL HIGH (ref 0.44–1.00)
GFR calc Af Amer: 32 mL/min — ABNORMAL LOW (ref >60)
GFR calc non Af Amer: 27 mL/min — ABNORMAL LOW (ref >60)
GLUCOSE: 89 mg/dL (ref 65–99)
POTASSIUM: 4.7 mmol/L (ref 3.5–5.1)
SODIUM: 135 mmol/L (ref 135–145)
TOTAL PROTEIN: 7.1 g/dL (ref 6.5–8.1)

## 2017-05-16 LAB — GLUCOSE, CAPILLARY
GLUCOSE-CAPILLARY: 115 mg/dL — AB (ref 65–99)
GLUCOSE-CAPILLARY: 108 mg/dL — AB (ref 65–99)
Glucose-Capillary: 87 mg/dL (ref 65–99)
Glucose-Capillary: 111 mg/dL — ABNORMAL HIGH (ref 65–99)

## 2017-05-16 LAB — HIV ANTIBODY (ROUTINE TESTING W REFLEX): HIV SCREEN 4TH GENERATION: NONREACTIVE

## 2017-05-16 MED ORDER — SORBITOL 70 % SOLN
960.0000 mL | TOPICAL_OIL | Freq: Once | ORAL | Status: AC
  Administered 2017-05-16: 960 mL via RECTAL
  Filled 2017-05-16: qty 240

## 2017-05-16 NOTE — Progress Notes (Signed)
[facility] Progress Note  Patient: Hayley Collins VOH:[id number]   PCP: Nolene Ebbs, MD DOB: [birth year]   DOA: 05/15/2017   DOS: 05/16/2017   Date of Service: the patient was seen and examined on 05/16/2017  Subjective: Continues to have abdominal pain as severe constipation. No nausea no vomiting. No fever no chills.  Brief hospital course: Pt. with PMH of HTN, type II DM, overweight in cancer with liver metastasis not on any treatment; admitted on 05/15/2017, presented with complaint of abdominal pain and constipation, was found to have fecal impaction. Currently further plan is supportive management.  Assessment and Plan: 1. Acute kidney injury. Serum creatinine 2.3 on admission. Getting better now. Baseline is normal. Continue with aggressive IV hydration. Likely poor oral intake in the setting of dehydration. Also hold losartan. Monitor ins and outs and daily weight.  2. Goals of care discussion. Ovarian cancer with liver metastasis. Seen GYN oncology in April 2018, decided not to go for any chemotherapy. Not a candidate for any surgical therapy due to metastasis. Has refused palliative care in the past but currently agreeable for the option of hospice. Continue supportive measure for pain control. Palliative care consulted.  3. Fecal impaction, severe constipation. CT abdomen shows no evidence of obstruction. Manual impaction done multiple times, will provide enema. Continue stool softener. No evidence of obstruction on CT scan.  4. Essential hypertension. Blood pressure currently stable. Discontinue losartan due to acute kidney injury.  5. Type 2 diabetes mellitus. He winces significantly elevated, uncontrolled. On metformin at home currently on hold due to acute kidney injury. Continue sliding scale insulin sensitive.  6. Microcytic anemia. Hemoglobin stable no active bleeding. Continue to monitor at present.  7. Elevated LFT. Mildly worsened, likely in the  setting of liver metastasis. CT abdomen shows no other evidence of acute abnormality. Avoid nephrotoxic medications.  Diet: Regular diet DVT Prophylaxis: subcutaneous Heparin  Advance goals of care discussion: full code  Family Communication: no family was present at bedside, at the time of interview.  Disposition:  Discharge to home.  Consultants: palliaitve care Procedures: noen  Antibiotics: Anti-infectives    None       Objective: Physical Exam: Vitals:   05/15/17 2242 05/16/17 0055 05/16/17 0648 05/16/17 1400  BP: 120/65 125/67 (!) 94/52 130/83  Pulse: (!) 102 88 81 92  Resp: 18 20 20 20   Temp:  98.5 F (36.9 C) 98.4 F (36.9 C) 98.3 F (36.8 C)  TempSrc:  Oral Oral Oral  SpO2: 100% 100% 96% 100%  Weight:  69.8 kg (153 lb 14.1 oz)    Height:  5\' 6"  (1.676 m)      Intake/Output Summary (Last 24 hours) at 05/16/17 1438 Last data filed at 05/16/17 1416  Gross per 24 hour  Intake             1480 ml  Output              700 ml  Net              780 ml   Filed Weights   05/15/17 1117 05/16/17 0055  Weight: 72.6 kg (160 lb) 69.8 kg (153 lb 14.1 oz)   General: Alert, Awake and Oriented to Time, Place and Person. Appear in moderate distress, affect flat Eyes: PERRL, Conjunctiva normal ENT: Oral Mucosa clear dry. Neck: no JVD, no Abnormal Mass Or lumps Cardiovascular: S1 and S2 Present, no Murmur, Peripheral Pulses Present Respiratory: normal respiratory effort, Bilateral Air entry equal  and Decreased, no use of accessory muscle, Clear to Auscultation, no Crackles, no wheezes Abdomen: Bowel Sound present, Soft and mild tenderness, no hernia Skin: no redness, no Rash, no induration Extremities: no Pedal edema, no calf tenderness Neurologic: Grossly no focal neuro deficit. Bilaterally Equal motor strength  Data Reviewed: CBC:  Recent Labs Lab 05/15/17 1318 05/15/17 2015 05/16/17 0348  WBC 18.1*  --  14.8*  NEUTROABS 13.7*  --  11.1*  HGB 8.7* 8.8* 8.4*   HCT 26.8* 26.0* 26.8*  MCV 74.2*  --  75.9*  PLT 742*  --  235*   Basic Metabolic Panel:  Recent Labs Lab 05/15/17 2000 05/15/17 2015 05/16/17 0348  NA 134* 137 135  K 5.1 5.2* 4.7  CL 103 108 103  CO2 18*  --  18*  GLUCOSE 80 80 89  BUN 42* 42* 37*  CREATININE 2.31* 2.70* 2.06*  CALCIUM 9.6  --  9.5    Liver Function Tests:  Recent Labs Lab 05/15/17 2000 05/16/17 0348  AST 109* 112*  ALT 46 43  ALKPHOS 628* 627*  BILITOT 13.9* 14.3*  PROT 7.2 7.1  ALBUMIN 2.1* 2.0*    Recent Labs Lab 05/15/17 1447  LIPASE 82*   No results for input(s): AMMONIA in the last 168 hours. Coagulation Profile: No results for input(s): INR, PROTIME in the last 168 hours. Cardiac Enzymes: No results for input(s): CKTOTAL, CKMB, CKMBINDEX, TROPONINI in the last 168 hours. BNP (last 3 results) No results for input(s): PROBNP in the last 8760 hours. CBG:  Recent Labs Lab 05/16/17 0741 05/16/17 1211  GLUCAP 87 115*   Studies: Ct Abdomen Pelvis Wo Contrast  Result Date: 05/15/2017 CLINICAL DATA:  46 y/o female with metastatic adenocarcinoma, GYN origin suspected on pathology analysis of liver biopsy. Pain with no bowel movement for 3 weeks. Jaundice. Renal insufficiency. EXAM: CT ABDOMEN AND PELVIS WITHOUT CONTRAST TECHNIQUE: Multidetector CT imaging of the abdomen and pelvis was performed following the standard protocol without IV contrast. COMPARISON:  CT Abdomen and Pelvis 04/16/2017, and earlier FINDINGS: Lower chest: Lung bases are stable an negative. No pericardial or pleural effusion. Hepatobiliary: Moderate to severe hepatomegaly (liver greater than 33 cm in length) with widespread hepatic metastases which were better demonstrated with the use of IV contrast on the prior studies. Trace perihepatic free fluid along the right lobe (series 2, image 52). Grossly negative gallbladder. Pancreas: No biliary ductal enlargement is evident. Negative noncontrast pancreas. Spleen: Negative  noncontrast spleen. Adrenals/Urinary Tract: Negative noncontrast adrenal glands and kidneys. No hydronephrosis. No hydroureter is evident, although the course of both ureters is difficult to delineate. Moderately dilated but otherwise unremarkable urinary bladder. Stomach/Bowel: Moderate volume of retained stool in the colon. Regional mass effect including on the bowel from the enlarged liver and uterus/adnexa. This is most pronounced at the hepatic flexure where the colon is relatively compressed between the liver and kidney (series 2, image 55). No dilated small bowel. Negative stomach. No definite mesenteric free fluid. No free air. Vascular/Lymphatic: Vascular patency is not evaluated in the absence of IV contrast. No lymphadenopathy is evident in the noncontrast abdomen or pelvis. Reproductive: Lobulated uterine and right adnexal region soft tissue masses re- demonstrated. Noncontrast appearance is stable compared to 04/16/2017. See series 2, image 66 and 74. There is some regional mass effect noted, including on the sigmoid colon, although this is relatively mild. No distal large bowel compression is identified. Other: No pelvic free fluid. Musculoskeletal: No acute or suspicious osseous lesion identified.  IMPRESSION: 1. Moderate to severe hepatomegaly with extensive liver metastases. Lobulated uterine and right adnexal enlargement appear stable since 04/16/2017. 2. Up to moderate retained stool in the colon but no evidence of mechanical bowel obstruction, although there is some mass effect on the large bowel related to #1 - most pronounced at the hepatic flexure. 3. No new metastatic disease identified in the absence of IV contrast. Electronically Signed   By: Genevie Ann M.D.   On: 05/15/2017 21:13    Scheduled Meds: . heparin  5,000 Units Subcutaneous Q8H  . lidocaine  1 application Other Once  . sorbitol, milk of mag, mineral oil, glycerin (SMOG) enema  960 mL Rectal Once   Continuous Infusions: PRN  Meds: acetaminophen **OR** acetaminophen, albuterol, budesonide, magnesium citrate, ondansetron **OR** ondansetron (ZOFRAN) IV, oxyCODONE, sorbitol  Time spent: 35 minutes  Author: Berle Mull, MD [facility] Pager: [phone] 05/16/2017 2:38 PM  If 7PM-7AM, please contact night-coverage at [email or web address], password [email or web address]

## 2017-05-16 NOTE — Consult Note (Signed)
Consultation Note Date: 05/16/2017   Patient Name: Hayley Collins  DOB: 04-Oct-1971  MRN: [id number]  Age / Sex: 46 y.o., female  PCP: Nolene Ebbs, MD Referring Physician: Lavina Hamman, MD  Reason for Consultation: Establishing goals of care  HPI/Patient Profile: 46 y.o. female  with past medical history of stage IV ovarian cancer, T2DM,  admitted on 05/15/2017 with AKI, abdominal pain, and constipation and found to have continued disease progression.  She was evaluated by gyn-onc in April, but she was not a surgical candidate and declined recommended chemotherapy.  She also declined hospice support at that time.  Palliative consulted for goals of care.   Clinical Assessment and Goals of Care: I met today with Ms. Timbrook.  Family not present at time of our encounter.  She reports that the most important thing to her has always been her church.  She attends [employer] in [place] and relies heavily on this congregation for support.  Her family, including her husband and daughter, are also very important to her.  We discussed clinical course as well as wishes moving forward in regard to advanced directives in light of continued progression of her cancer.  We discussed that if she is not pursuing disease modifying therapies, this does not mean that there is not care to give, but that we need to focus care on her feeling as well as possible and maintaining functional status for as long as possible.  We discussed hospice and how they can benefit her through their team approach to care.  She reports that she is not opposed to hospice, and she had a good experience with hospice with her mother.  She reports that she declined hospice prior as she was feeling well at that time and felt that hospice was presented a "threat" when she declined chemo.  Concept of Hospice and Palliative Care were  discussed  Questions and concerns addressed.   PMT will continue to support holistically.   SUMMARY OF RECOMMENDATIONS   - Patient reports needing time to discuss with family and pray about her next steps.  I recommended she consider electing her hospice benefit to better support both her and her family.  Code Status/Advance Care Planning:  Full code   Symptom Management:   Constipation: Multiple interventions already ordered.  Continue mgt per primary  Palliative Prophylaxis:   Bowel Regimen and Frequent Pain Assessment  Psycho-social/Spiritual:   Desire for further Chaplaincy support:no  Additional Recommendations: Education on Hospice  Prognosis:   < 6 months  Discharge Planning: To Be Determined      Primary Diagnoses: Present on Admission: . Secondary malignant neoplasm of liver ([place]) . Gynecologic malignancy ([place]) . AKI (acute kidney injury) ([place]) . Constipation . Microcytic anemia . Thrombocytosis ([place]) . Hypertension   I have reviewed the medical record, interviewed the patient and family, and examined the patient. The following aspects are pertinent.  Past Medical History:  Diagnosis Date  . Cancer ([place])   . Diabetes mellitus   . Fatty liver   .  GERD (gastroesophageal reflux disease)   . Hypertension    Social History   Social History  . Marital status: Married    Spouse name: N/A  . Number of children: N/A  . Years of education: N/A   Social History Main Topics  . Smoking status: Current Every Day Smoker    Types: E-cigarettes  . Smokeless tobacco: Never Used  . Alcohol use No  . Drug use: No  . Sexual activity: Not Asked   Other Topics Concern  . None   Social History Narrative  . None   Family History  Problem Relation Age of Onset  . Hypertension Other   . Diabetes Other    Scheduled Meds: . heparin  5,000 Units Subcutaneous Q8H  . lidocaine  1 application Other Once   Continuous Infusions: PRN Meds:.acetaminophen  **OR** acetaminophen, albuterol, budesonide, magnesium citrate, ondansetron **OR** ondansetron (ZOFRAN) IV, oxyCODONE, sorbitol Medications Prior to Admission:  Prior to Admission medications   Medication Sig Start Date End Date Taking? Authorizing Provider  albuterol (PROVENTIL HFA;VENTOLIN HFA) 108 (90 Base) MCG/ACT inhaler Inhale 1-2 puffs into the lungs every 6 (six) hours as needed for wheezing. 03/16/17  Yes Tanna Furry, MD  beclomethasone (QVAR) 80 MCG/ACT inhaler Inhale 2 puffs into the lungs 2 (two) times daily as needed (asthma).    Yes [provider]  losartan (COZAAR) 50 MG tablet Take 50 mg by mouth daily.   Yes [provider]  metFORMIN (GLUCOPHAGE) 1000 MG tablet Take 1,000 mg by mouth daily with breakfast.    Yes [provider]  ondansetron (ZOFRAN) 4 MG tablet Take 1 tablet (4 mg total) by mouth every 6 (six) hours. 7/35/32  Yes Delora Fuel, MD  oxyCODONE (ROXICODONE) 5 MG immediate release tablet Take 2 tablets (10 mg total) by mouth every 4 (four) hours as needed for severe pain. 9/92/42  Yes Delora Fuel, MD  docusate sodium (COLACE) 100 MG capsule Take 1 capsule (100 mg total) by mouth 2 (two) times daily. 05/15/17   Gareth Morgan, MD  Lidocaine 2 % GEL Apply 1 application topically 2 (two) times daily as needed (bowel movement pain). 05/15/17   Gareth Morgan, MD   No Known Allergies Review of Systems  Constitutional: Positive for activity change, appetite change and fatigue.  Gastrointestinal: Positive for abdominal distention, abdominal pain and constipation.  Neurological: Positive for weakness.  Psychiatric/Behavioral: Positive for sleep disturbance.   Physical Exam General: Alert, awake, in no acute distress. Weak appearing Heart: Regular rate and rhythm. No murmur appreciated. Lungs: Good air movement, clear Abdomen: Soft, distended, positive bowel sounds.  Skin: Warm and dry Neuro: Grossly intact, nonfocal.  Vital Signs: BP  130/83 (BP Location: Right Arm)   Pulse 92   Temp 98.3 F (36.8 C) (Oral)   Resp 20   Ht _0  (1.676 m)   Wt 69.8 kg (153 lb 14.1 oz)   LMP 04/14/2017   SpO2 100%   BMI 24.84 kg/m  Pain Assessment: 0-10   Pain Score: 8    SpO2: SpO2: 100 % O2 Device:SpO2: 100 % O2 Flow Rate: .   IO: Intake/output summary:  Intake/Output Summary (Last 24 hours) at 05/16/17 1815 Last data filed at 05/16/17 1541  Gross per 24 hour  Intake             1320 ml  Output              700 ml  Net  620 ml    LBM: Last BM Date: 05/15/17 Baseline Weight: Weight: 72.6 kg (160 lb) Most recent weight: Weight: 69.8 kg (153 lb 14.1 oz)     Palliative Assessment/Data:   Flowsheet Rows     Most Recent Value  Intake Tab  Referral Department  Hospitalist  Unit at Time of Referral  Oncology Unit  Palliative Care Primary Diagnosis  Cancer  Date Notified  05/16/17  Palliative Care Type  New Palliative care  Reason for referral  Clarify Goals of Care, Pain, Non-pain Symptom  Date of Admission  05/15/17  Date first seen by Palliative Care  05/16/17  # of days Palliative referral response time  0 Day(s)  # of days IP prior to Palliative referral  1  Clinical Assessment  Palliative Performance Scale Score  30%  Pain Max last 24 hours  9  Pain Min Last 24 hours  3  Psychosocial & Spiritual Assessment  Palliative Care Outcomes  Patient/Family meeting held?  Yes  Who was at the meeting?  Patient     Time Total: 60 Greater than 50%  of this time was spent counseling and coordinating care related to the above assessment and plan.  Signed by: Micheline Rough, MD   Please contact Palliative Medicine Team phone at [phone] for questions and concerns.  For individual provider: See Shea Evans

## 2017-05-17 DIAGNOSIS — R740 Nonspecific elevation of levels of transaminase and lactic acid dehydrogenase [LDH]: Secondary | ICD-10-CM

## 2017-05-17 DIAGNOSIS — D509 Iron deficiency anemia, unspecified: Secondary | ICD-10-CM

## 2017-05-17 DIAGNOSIS — I10 Essential (primary) hypertension: Secondary | ICD-10-CM

## 2017-05-17 DIAGNOSIS — C787 Secondary malignant neoplasm of liver and intrahepatic bile duct: Secondary | ICD-10-CM

## 2017-05-17 DIAGNOSIS — D473 Essential (hemorrhagic) thrombocythemia: Secondary | ICD-10-CM

## 2017-05-17 DIAGNOSIS — R7401 Elevation of levels of liver transaminase levels: Secondary | ICD-10-CM

## 2017-05-17 DIAGNOSIS — E119 Type 2 diabetes mellitus without complications: Secondary | ICD-10-CM

## 2017-05-17 DIAGNOSIS — N179 Acute kidney failure, unspecified: Principal | ICD-10-CM

## 2017-05-17 DIAGNOSIS — K5903 Drug induced constipation: Secondary | ICD-10-CM

## 2017-05-17 LAB — CBC
HCT: 24.4 % — ABNORMAL LOW (ref 36.0–46.0)
HEMOGLOBIN: 7.4 g/dL — AB (ref 12.0–15.0)
MCH: 23.3 pg — ABNORMAL LOW (ref 26.0–34.0)
MCHC: 30.3 g/dL (ref 30.0–36.0)
MCV: 76.7 fL — ABNORMAL LOW (ref 78.0–100.0)
PLATELETS: 457 10*3/uL — AB (ref 150–400)
RBC: 3.18 MIL/uL — AB (ref 3.87–5.11)
RDW: 18.8 % — ABNORMAL HIGH (ref 11.5–15.5)
WBC: 15.9 10*3/uL — AB (ref 4.0–10.5)

## 2017-05-17 LAB — BASIC METABOLIC PANEL
ANION GAP: 11 (ref 5–15)
BUN: 28 mg/dL — ABNORMAL HIGH (ref 6–20)
CO2: 17 mmol/L — ABNORMAL LOW (ref 22–32)
Calcium: 9.2 mg/dL (ref 8.9–10.3)
Chloride: 109 mmol/L (ref 101–111)
Creatinine, Ser: 1.51 mg/dL — ABNORMAL HIGH (ref 0.44–1.00)
GFR, EST AFRICAN AMERICAN: 46 mL/min — AB (ref >60)
GFR, EST NON AFRICAN AMERICAN: 40 mL/min — AB (ref >60)
Glucose, Bld: 74 mg/dL (ref 65–99)
POTASSIUM: 4.3 mmol/L (ref 3.5–5.1)
SODIUM: 137 mmol/L (ref 135–145)

## 2017-05-17 LAB — MAGNESIUM: Magnesium: 1.9 mg/dL (ref 1.7–2.4)

## 2017-05-17 MED ORDER — METHYLNALTREXONE BROMIDE 12 MG/0.6ML ~~LOC~~ SOLN
12.0000 mg | Freq: Once | SUBCUTANEOUS | Status: DC
  Filled 2017-05-17: qty 0.6

## 2017-05-17 MED ORDER — POLYETHYLENE GLYCOL 3350 17 G PO PACK
17.0000 g | PACK | Freq: Two times a day (BID) | ORAL | Status: DC
  Administered 2017-05-17 – 2017-05-18 (×2): 17 g via ORAL
  Filled 2017-05-17 (×3): qty 1

## 2017-05-17 MED ORDER — POLYETHYLENE GLYCOL 3350 17 G PO PACK
17.0000 g | PACK | Freq: Every day | ORAL | Status: DC
  Administered 2017-05-17: 17 g via ORAL
  Filled 2017-05-17: qty 1

## 2017-05-17 MED ORDER — ENSURE ENLIVE PO LIQD
237.0000 mL | Freq: Two times a day (BID) | ORAL | Status: DC
  Administered 2017-05-17: 237 mL via ORAL

## 2017-05-17 NOTE — Progress Notes (Signed)
Daily Progress Note   Patient Name: Hayley Collins       Date: 05/17/2017 DOB: 08-Jan-1971  Age: 46 y.o. MRN#: [id number] Attending Physician: Charlynne Cousins, MD Primary Care Physician: Nolene Ebbs, MD Admit Date: 05/15/2017  Reason for Consultation/Follow-up: Establishing goals of care  Subjective: I met today with Hayley Collins and Hayley Collins husband.  She reports being overwhelmed today and is polite throughout encounter but does not really engage in conversation.  Length of Stay: 2  Current Medications: Scheduled Meds:  . feeding supplement (ENSURE ENLIVE)  237 mL Oral BID BM  . heparin  5,000 Units Subcutaneous Q8H  . lidocaine  1 application Other Once  . methylnaltrexone  12 mg Subcutaneous Once  . polyethylene glycol  17 g Oral BID    Continuous Infusions:   PRN Meds: albuterol, budesonide, magnesium citrate, ondansetron **OR** ondansetron (ZOFRAN) IV, oxyCODONE, sorbitol  Physical Exam         General: Alert, awake, in no acute distress. Weak appearing Heart: Regular rate and rhythm. No murmur appreciated. Lungs: Good air movement, clear Abdomen: Soft, distended, positive bowel sounds.  Skin: Warm and dry Neuro: Grossly intact, nonfocal.  Vital Signs: BP 118/76 (BP Location: Right Arm)   Pulse 87   Temp 98.9 F (37.2 C) (Oral)   Resp 16   Ht _0  (1.676 m)   Wt 68.9 kg (152 lb)   LMP 04/14/2017   SpO2 99%   BMI 24.53 kg/m  SpO2: SpO2: 99 % O2 Device: O2 Device: Not Delivered O2 Flow Rate:    Intake/output summary:  Intake/Output Summary (Last 24 hours) at 05/17/17 1232 Last data filed at 05/16/17 1541  Gross per 24 hour  Intake             1080 ml  Output                0 ml  Net             1080 ml   LBM: Last BM Date: 05/16/17 Baseline Weight:  Weight: 72.6 kg (160 lb) Most recent weight: Weight: 68.9 kg (152 lb)       Palliative Assessment/Data:    Flowsheet Rows     Most Recent Value  Intake Tab  Referral Department  Hospitalist  Unit at Time of  Referral  Oncology Unit  Palliative Care Primary Diagnosis  Cancer  Date Notified  05/16/17  Palliative Care Type  New Palliative care  Reason for referral  Clarify Goals of Care, Pain, Non-pain Symptom  Date of Admission  05/15/17  Date first seen by Palliative Care  05/16/17  # of days Palliative referral response time  0 Day(s)  # of days IP prior to Palliative referral  1  Clinical Assessment  Palliative Performance Scale Score  30%  Pain Max last 24 hours  9  Pain Min Last 24 hours  3  Psychosocial & Spiritual Assessment  Palliative Care Outcomes  Patient/Family meeting held?  Yes  Who was at the meeting?  Patient      Patient Active Problem List   Diagnosis Date Noted  . Transaminitis 05/17/2017  . AKI (acute kidney injury) ([place]) 05/15/2017  . Constipation 05/15/2017  . Microcytic anemia 05/15/2017  . Thrombocytosis ([place]) 05/15/2017  . Hypertension 05/15/2017  . Secondary malignant neoplasm of liver ([place]) 01/16/2017  . Gynecologic malignancy ([place]) 01/16/2017  . Ovarian mass, right 12/26/2016  . Enlarged uterus 12/26/2016  . Liver masses 12/26/2016  . Diabetes mellitus type II, non insulin dependent ([place]) 12/26/2016    Palliative Care Assessment & Plan   Patient Profile: 46 y.o. female  with past medical history of stage IV ovarian cancer, T2DM,  admitted on 05/15/2017 with AKI, abdominal pain, and constipation and found to have continued disease progression.  She was evaluated by gyn-onc in April, but she was not a surgical candidate and declined recommended chemotherapy.  She also declined hospice support at that time.  Palliative consulted for goals of care.   Recommendations/Plan:  I discussed again with Hayley Collins and Hayley Collins husband regarding care  plan moving forward.  We discussed goals of living as well as possible for as long as possible and how hospice can assist with symptom management to allow Hayley Collins to preserve Hayley Collins functional status for as long as possible.    Hayley Collins husband is trying to be supportive, however, she told him multiple times that he "can't possibly understand what I am going through right now."  She reports wanting to have time to herself to consider options.  She is feeling overwhelmed and did not wish to discuss further today.  Code Status:    Code Status Orders        Start     Ordered   05/15/17 2342  Full code  Continuous     05/15/17 2343    Code Status History    Date Active Date Inactive Code Status Order ID Comments User Context   This patient has a current code status but no historical code status.       Prognosis:   < 6 months  Discharge Planning:  To Be Determined- Recommend home with hospice  Care plan was discussed with Patient, husband, Dr. Venetia Constable Thank you for allowing the Palliative Medicine Team to assist in the care of this patient.   Time In: 1100 Time Out: 1130 Total Time 30 Prolonged Time Billed No      Greater than 50%  of this time was spent counseling and coordinating care related to the above assessment and plan.  Micheline Rough, MD  Please contact Palliative Medicine Team phone at [phone] for questions and concerns.

## 2017-05-17 NOTE — Progress Notes (Signed)
[facility] PROGRESS NOTE    Progress Note  Hayley Collins  DXA:[id number] DOB: [birth year] DOA: 05/15/2017 PCP: Nolene Ebbs, MD     Brief Narrative:   Hayley Collins is an 46 y.o. female past medical history of essential hypertension, diabetes mellitus type 2 with metastatic liver cancer not on treatment presented with complaint of abdominal pain constipation was found to have thecal impactactation.  Assessment/Plan:   AKI (acute kidney injury) ([place]): Likely prerenal in etiology in the setting of losartan use. Baseline creatinine less than 1, continues to improve with IV fluid hydration. Continue IV fluids. Basic metabolic panel in the morning.  Goals of care discussion: She has ovarian cancer with liver metastasis seen by GYN on April 2019 decided not to proceed with chemotherapy. She's not a surgical candidate due to metastatic disease. Patient has refused palliative care is certainly agreed with option of hospice.  Feculent dictation to severe constipation: CT scan of the abdomen and pelvis did not show any obstruction she was manually disimpacted multiple times. Will put on MiraLAX daily.  Essential hypertension: Blood pressure currently stable continue to hold losartan. Next  Diabetes mellitus type II, non insulin dependent ([facility]) Continue hold metformin, blood glucose seems to be in good control continue sliding scale insulin.  Secondary malignant neoplasm of liver ([facility])  Microcytic anemia: No active bleeding hematoma stable.  Mild transaminitis/severe Hepatomegaly: In the setting of liver metastases.  DVT prophylaxis: lovenox Family Communication:Husband Disposition Plan/Barrier to D/C: hopefully 1 days Code Status:     Code Status Orders        Start     Ordered   05/15/17 2342  Full code  Continuous     05/15/17 2343    Code Status History    Date Active Date Inactive Code Status Order ID Comments User Context   This patient has a current  code status but no historical code status.        IV Access:    Peripheral IV   Procedures and diagnostic studies:   Ct Abdomen Pelvis Wo Contrast  Result Date: 05/15/2017 CLINICAL DATA:  46 y/o female with metastatic adenocarcinoma, GYN origin suspected on pathology analysis of liver biopsy. Pain with no bowel movement for 3 weeks. Jaundice. Renal insufficiency. EXAM: CT ABDOMEN AND PELVIS WITHOUT CONTRAST TECHNIQUE: Multidetector CT imaging of the abdomen and pelvis was performed following the standard protocol without IV contrast. COMPARISON:  CT Abdomen and Pelvis 04/16/2017, and earlier FINDINGS: Lower chest: Lung bases are stable an negative. No pericardial or pleural effusion. Hepatobiliary: Moderate to severe hepatomegaly (liver greater than 33 cm in length) with widespread hepatic metastases which were better demonstrated with the use of IV contrast on the prior studies. Trace perihepatic free fluid along the right lobe (series 2, image 52). Grossly negative gallbladder. Pancreas: No biliary ductal enlargement is evident. Negative noncontrast pancreas. Spleen: Negative noncontrast spleen. Adrenals/Urinary Tract: Negative noncontrast adrenal glands and kidneys. No hydronephrosis. No hydroureter is evident, although the course of both ureters is difficult to delineate. Moderately dilated but otherwise unremarkable urinary bladder. Stomach/Bowel: Moderate volume of retained stool in the colon. Regional mass effect including on the bowel from the enlarged liver and uterus/adnexa. This is most pronounced at the hepatic flexure where the colon is relatively compressed between the liver and kidney (series 2, image 55). No dilated small bowel. Negative stomach. No definite mesenteric free fluid. No free air. Vascular/Lymphatic: Vascular patency is not evaluated in the absence of IV contrast. No lymphadenopathy  is evident in the noncontrast abdomen or pelvis. Reproductive: Lobulated uterine and right  adnexal region soft tissue masses re- demonstrated. Noncontrast appearance is stable compared to 04/16/2017. See series 2, image 66 and 74. There is some regional mass effect noted, including on the sigmoid colon, although this is relatively mild. No distal large bowel compression is identified. Other: No pelvic free fluid. Musculoskeletal: No acute or suspicious osseous lesion identified. IMPRESSION: 1. Moderate to severe hepatomegaly with extensive liver metastases. Lobulated uterine and right adnexal enlargement appear stable since 04/16/2017. 2. Up to moderate retained stool in the colon but no evidence of mechanical bowel obstruction, although there is some mass effect on the large bowel related to #1 - most pronounced at the hepatic flexure. 3. No new metastatic disease identified in the absence of IV contrast. Electronically Signed   By: Genevie Ann M.D.   On: 05/15/2017 21:13     Medical Consultants:    None.  Anti-Infectives:   None  Subjective:    Hayley Collins she relates her pain is not controlled.  Objective:    Vitals:   05/16/17 1400 05/16/17 2029 05/17/17 0024 05/17/17 0446  BP: 130/83 136/81  118/76  Pulse: 92 92  87  Resp: 20 18  16   Temp: 98.3 F (36.8 C) 98.6 F (37 C)  98.9 F (37.2 C)  TempSrc: Oral Oral  Oral  SpO2: 100% 99%  99%  Weight:   68.9 kg (152 lb)   Height:        Intake/Output Summary (Last 24 hours) at 05/17/17 1013 Last data filed at 05/16/17 1541  Gross per 24 hour  Intake             1080 ml  Output                0 ml  Net             1080 ml   Filed Weights   05/15/17 1117 05/16/17 0055 05/17/17 0024  Weight: 72.6 kg (160 lb) 69.8 kg (153 lb 14.1 oz) 68.9 kg (152 lb)    Exam: General exam: In no acute distress. Respiratory system: Good air movement and clear to auscultation. Cardiovascular system: Regular rate and rhythm with positive S1-S2. Gastrointestinal system: Positive bowel sounds soft distended with massive  hepatomegaly nontender. Central nervous system: Awake alert and oriented 3. Extremities: No pedal edema. Skin: No rashes, lesions or ulcers Psychiatry: Judgment and insight are appropriate.   Data Reviewed:    Labs: Basic Metabolic Panel:  Recent Labs Lab 05/15/17 2000 05/15/17 2015 05/16/17 0348 05/17/17 0345  NA 134* 137 135 137  K 5.1 5.2* 4.7 4.3  CL 103 108 103 109  CO2 18*  --  18* 17*  GLUCOSE 80 80 89 74  BUN 42* 42* 37* 28*  CREATININE 2.31* 2.70* 2.06* 1.51*  CALCIUM 9.6  --  9.5 9.2  MG  --   --   --  1.9   GFR Estimated Creatinine Clearance: 42.7 mL/min (A) (by C-G formula based on SCr of 1.51 mg/dL (H)). Liver Function Tests:  Recent Labs Lab 05/15/17 2000 05/16/17 0348  AST 109* 112*  ALT 46 43  ALKPHOS 628* 627*  BILITOT 13.9* 14.3*  PROT 7.2 7.1  ALBUMIN 2.1* 2.0*    Recent Labs Lab 05/15/17 1447  LIPASE 82*   No results for input(s): AMMONIA in the last 168 hours. Coagulation profile No results for input(s): INR, PROTIME in the last  168 hours.  CBC:  Recent Labs Lab 05/15/17 1318 05/15/17 2015 05/16/17 0348 05/17/17 0345  WBC 18.1*  --  14.8* 15.9*  NEUTROABS 13.7*  --  11.1*  --   HGB 8.7* 8.8* 8.4* 7.4*  HCT 26.8* 26.0* 26.8* 24.4*  MCV 74.2*  --  75.9* 76.7*  PLT 742*  --  418* 457*   Cardiac Enzymes: No results for input(s): CKTOTAL, CKMB, CKMBINDEX, TROPONINI in the last 168 hours. BNP (last 3 results) No results for input(s): PROBNP in the last 8760 hours. CBG:  Recent Labs Lab 05/16/17 0741 05/16/17 1211 05/16/17 1713 05/16/17 1954  GLUCAP 87 115* 108* 111*   D-Dimer: No results for input(s): DDIMER in the last 72 hours. Hgb A1c: No results for input(s): HGBA1C in the last 72 hours. Lipid Profile: No results for input(s): CHOL, HDL, LDLCALC, TRIG, CHOLHDL, LDLDIRECT in the last 72 hours. Thyroid function studies: No results for input(s): TSH, T4TOTAL, T3FREE, THYROIDAB in the last 72 hours.  Invalid  input(s): FREET3 Anemia work up: No results for input(s): VITAMINB12, FOLATE, FERRITIN, TIBC, IRON, RETICCTPCT in the last 72 hours. Sepsis Labs:  Recent Labs Lab 05/15/17 1318 05/16/17 0348 05/17/17 0345  WBC 18.1* 14.8* 15.9*   Microbiology No results found for this or any previous visit (from the past 240 hour(s)).   Medications:   . heparin  5,000 Units Subcutaneous Q8H  . lidocaine  1 application Other Once   Continuous Infusions:    LOS: 2 days   Charlynne Cousins  [facility] Pager [phone]  *Please refer to [email or web address], password [email or web address] to get updated schedule on who will round on this patient, as hospitalists switch teams weekly. If 7PM-7AM, please contact night-coverage at [email or web address], password [email or web address] for any overnight needs.  05/17/2017, 10:13 AM

## 2017-05-17 NOTE — Progress Notes (Signed)
Initial Nutrition Assessment  DOCUMENTATION CODES:   Severe malnutrition in context of chronic illness  INTERVENTION:   Provide Ensure Enlive po BID, each supplement provides 350 kcal and 20 grams of protein RD will continue to monitor   NUTRITION DIAGNOSIS:   Malnutrition (severe) related to chronic illness, cancer and cancer related treatments as evidenced by percent weight loss, energy intake < or equal to 75% for > or equal to 1 month, mild depletion of body fat, moderate depletions of muscle mass.  GOAL:   Patient will meet greater than or equal to 90% of their needs  MONITOR:   PO intake, Supplement acceptance, Labs, Weight trends, I & O's  REASON FOR ASSESSMENT:   Malnutrition Screening Tool    ASSESSMENT:   Pt. with PMH of HTN, type II DM, overweight in cancer with liver metastasis not on any treatment; admitted on 05/15/2017, presented with complaint of abdominal pain and constipation, was found to have fecal impaction.  Patient with husband at bedside. Pt flat in affect and expressed that she feels depressed. Palliative care following patient and working with pt to clarify goals. Pt refusing treatments of metastatic ovarian cancer. Reports constipation for 2-3 weeks with poor PO intake. Pt was manually disimpacted 8/1. Now on Miralax to stimulate a BM today. Encouraged pt to move around her room and to sip fluids.  Pt states she did eat 50% of a breakfast meal of oatmeal, omelet, and bacon.  She requests an Ensure and states she drinks these at home. RD provided pt Ensure over ice to sip on.   Per chart review, pt has lost 62 lb since 3/6 (29% wt loss x 5 months, significant for time frame). Nutrition-Focused physical exam completed. Findings are mild fat depletion, moderate muscle depletion, and no edema.   Medications: Miralax packet BID  Labs reviewed: GFR: 46  Diet Order:  Diet regular Room service appropriate? Yes; Fluid consistency: Thin  Skin:  Reviewed, no  issues  Last BM:  8/1  Height:   Ht Readings from Last 1 Encounters:  05/16/17 5\' 6"  (1.676 m)    Weight:   Wt Readings from Last 1 Encounters:  05/17/17 152 lb (68.9 kg)    Ideal Body Weight:  59.1 kg  BMI:  Body mass index is 24.53 kg/m.  Estimated Nutritional Needs:   Kcal:  1800-2000  Protein:  65-75g  Fluid:  2L/day  EDUCATION NEEDS:   Education needs addressed  Clayton Bibles, MS, RD, LDN Pager: [phone] After Hours Pager: [phone]

## 2017-05-18 DIAGNOSIS — R52 Pain, unspecified: Secondary | ICD-10-CM

## 2017-05-18 LAB — BASIC METABOLIC PANEL
ANION GAP: 12 (ref 5–15)
BUN: 22 mg/dL — AB (ref 6–20)
CHLORIDE: 106 mmol/L (ref 101–111)
CO2: 18 mmol/L — ABNORMAL LOW (ref 22–32)
Calcium: 9.4 mg/dL (ref 8.9–10.3)
Creatinine, Ser: 1.29 mg/dL — ABNORMAL HIGH (ref 0.44–1.00)
GFR calc Af Amer: 56 mL/min — ABNORMAL LOW (ref >60)
GFR, EST NON AFRICAN AMERICAN: 48 mL/min — AB (ref >60)
GLUCOSE: 77 mg/dL (ref 65–99)
POTASSIUM: 3.8 mmol/L (ref 3.5–5.1)
Sodium: 136 mmol/L (ref 135–145)

## 2017-05-18 LAB — CBC
HEMATOCRIT: 25.5 % — AB (ref 36.0–46.0)
HEMOGLOBIN: 7.8 g/dL — AB (ref 12.0–15.0)
MCH: 23.6 pg — AB (ref 26.0–34.0)
MCHC: 30.6 g/dL (ref 30.0–36.0)
MCV: 77 fL — AB (ref 78.0–100.0)
PLATELETS: 469 10*3/uL — AB (ref 150–400)
RBC: 3.31 MIL/uL — AB (ref 3.87–5.11)
RDW: 18.5 % — ABNORMAL HIGH (ref 11.5–15.5)
WBC: 15.2 10*3/uL — AB (ref 4.0–10.5)

## 2017-05-18 LAB — GLUCOSE, CAPILLARY: GLUCOSE-CAPILLARY: 78 mg/dL (ref 65–99)

## 2017-05-18 MED ORDER — ONDANSETRON HCL 4 MG PO TABS: 4.0000 mg | ORAL_TABLET | Freq: Four times a day (QID) | ORAL | 0 refills | Status: AC

## 2017-05-18 MED ORDER — POLYETHYLENE GLYCOL 3350 17 G PO PACK: 17.0000 g | PACK | Freq: Every day | ORAL | 0 refills | Status: AC

## 2017-05-18 MED ORDER — OXYCODONE HCL 5 MG PO TABS: 10.0000 mg | ORAL_TABLET | ORAL | 0 refills | Status: AC | PRN

## 2017-05-18 NOTE — Progress Notes (Signed)
    Durable Medical Equipment        Start     Ordered   05/18/17 [phone]  For home use only DME lightweight manual wheelchair with seat cushion  Once    Comments:  Patient suffers from liver cancer which impairs their ability to perform daily activities like walking in the home.  A walker will not resolve  issue with performing activities of daily living. A wheelchair will allow patient to safely perform daily activities. Patient is not able to propel themselves in the home using a standard weight wheelchair due to weakness. Patient can self propel in the lightweight wheelchair.  Accessories: elevating leg rests (ELRs), wheel locks, extensions and anti-tippers.   05/18/17 0956   05/18/17 0954  For home use only DME Walker rolling  Once    Question:  Patient needs a walker to treat with the following condition  Answer:  Weakness   05/18/17 0956   05/18/17 0952  For home use only DME 3 n 1  Once     05/18/17 9532

## 2017-05-18 NOTE — Care Management Note (Signed)
Case Management Note  Patient Details  Name: GINI CAPUTO MRN: [id number] Date of Birth: 25-Feb-1971  Subjective/Objective:    46 yo admitted with AKI. Hx of metastatic cancer               Action/Plan: This CM met with pt and husband at bedside about discharge needs. Pt requesting RW, 3in1, and wheelchair. Orders received and [facility] rep alerted of orders. Home hospice provider list provided to pt for future reference.  Expected Discharge Date:  05/18/17               Expected Discharge Plan:  Home/Self Care  In-House Referral:     Discharge planning Services  CM Consult  Post Acute Care Choice:    Choice offered to:     DME Arranged:  3-N-1, Lightweight manual wheelchair with seat cushion, Walker rolling DME Agency:  [place]:    [place]:     Status of Service:  Completed, signed off  If discussed at [employer] of Stay Meetings, dates discussed:    Additional CommentsLynnell Catalan, RN 05/18/2017, 10:17 AM  [phone]

## 2017-05-18 NOTE — Progress Notes (Signed)
Daily Progress Note   Patient Name: Hayley Collins       Date: 05/18/2017 DOB: 1971/01/24  Age: 46 y.o. MRN#: [id number] Attending Physician: Charlynne Cousins, MD Primary Care Physician: Nolene Ebbs, MD Admit Date: 05/15/2017  Reason for Consultation/Follow-up: Establishing goals of care  Subjective: I met today with Ms. Zafar and her husband.  She continues to be overwhelmed and does not really engage in conversation.  Length of Stay: 3  Current Medications: Scheduled Meds:  . feeding supplement (ENSURE ENLIVE)  237 mL Oral BID BM  . heparin  5,000 Units Subcutaneous Q8H  . lidocaine  1 application Other Once  . methylnaltrexone  12 mg Subcutaneous Once  . polyethylene glycol  17 g Oral BID    Continuous Infusions:   PRN Meds: albuterol, budesonide, magnesium citrate, ondansetron **OR** ondansetron (ZOFRAN) IV, oxyCODONE, sorbitol  Physical Exam         General: Alert, awake, in no acute distress. Weak appearing Heart: Regular rate and rhythm. No murmur appreciated. Lungs: Good air movement, clear Abdomen: Soft, distended, positive bowel sounds.  Skin: Warm and dry Neuro: Grossly intact, nonfocal.  Vital Signs: BP 133/86 (BP Location: Right Arm)   Pulse 86   Temp 98.4 F (36.9 C) (Oral)   Resp 18   Ht '5\' 6"'$  (1.676 m)   Wt 68.9 kg (151 lb 14.4 oz)   LMP 04/14/2017   SpO2 98%   BMI 24.52 kg/m  SpO2: SpO2: 98 % O2 Device: O2 Device: Not Delivered O2 Flow Rate:    Intake/output summary:   Intake/Output Summary (Last 24 hours) at 05/18/17 0940 Last data filed at 05/17/17 2100  Gross per 24 hour  Intake              370 ml  Output                0 ml  Net              370 ml   LBM: Last BM Date: 05/17/17 Baseline Weight: Weight: 72.6 kg (160  lb) Most recent weight: Weight: 68.9 kg (151 lb 14.4 oz)       Palliative Assessment/Data:    Flowsheet Rows     Most Recent Value  Intake Tab  Referral Department  Hospitalist  Unit at  Time of Referral  Oncology Unit  Palliative Care Primary Diagnosis  Cancer  Date Notified  05/16/17  Palliative Care Type  New Palliative care  Reason for referral  Clarify Goals of Care, Pain, Non-pain Symptom  Date of Admission  05/15/17  Date first seen by Palliative Care  05/16/17  # of days Palliative referral response time  0 Day(s)  # of days IP prior to Palliative referral  1  Clinical Assessment  Palliative Performance Scale Score  30%  Pain Max last 24 hours  9  Pain Min Last 24 hours  3  Psychosocial & Spiritual Assessment  Palliative Care Outcomes  Patient/Family meeting held?  Yes  Who was at the meeting?  Patient      Patient Active Problem List   Diagnosis Date Noted  . Transaminitis 05/17/2017  . AKI (acute kidney injury) (HCC) 05/15/2017  . Constipation 05/15/2017  . Microcytic anemia 05/15/2017  . Thrombocytosis (HCC) 05/15/2017  . Hypertension 05/15/2017  . Secondary malignant neoplasm of liver (HCC) 01/16/2017  . Gynecologic malignancy (HCC) 01/16/2017  . Ovarian mass, right 12/26/2016  . Enlarged uterus 12/26/2016  . Liver masses 12/26/2016  . Diabetes mellitus type II, non insulin dependent (HCC) 12/26/2016    Palliative Care Assessment & Plan   Patient Profile: 46 y.o. female  with past medical history of stage IV ovarian cancer, T2DM,  admitted on 05/15/2017 with AKI, abdominal pain, and constipation and found to have continued disease progression.  She was evaluated by gyn-onc in April, but she was not a surgical candidate and declined recommended chemotherapy.  She also declined hospice support at that time.  Palliative consulted for goals of care.   Recommendations/Plan:  I discussed again with Ms. Sallade and her husband regarding care plan moving  forward.  We discussed goals of living as well as possible for as long as possible and how hospice can assist with symptom management to allow her to preserve her functional status for as long as possible.   She reports still needing to pray about her options.  See discharge planning below.  Code Status:    Code Status Orders        Start     Ordered   05/15/17 2342  Full code  Continuous     05/15/17 2343    Code Status History    Date Active Date Inactive Code Status Order ID Comments User Context   This patient has a current code status but no historical code status.       Prognosis:   < 6 months  Discharge Planning:  To Be Determined- Recommend home with hospice.  Patient reports still deciding what she wants to do.  Her husband is encouraging her to select home hospice and choose agency, but also reports that this is her decision and he does not want to pressure her.  We discussed how to access hospice services through her PCP after discharge if she does not make decision prior to leaving the hospital.  Care management to follow-up to present home hospice options to family.  Care plan was discussed with Patient, husband, Care management Thank you for allowing the Palliative Medicine Team to assist in the care of this patient.   Time In: 0910 Time Out: 0940 Total Time 30 Prolonged Time Billed No      Greater than 50%  of this time was spent counseling and coordinating care related to the above assessment and plan.  Romie Minus, MD  Please  contact Palliative Medicine Team phone at [phone] for questions and concerns.

## 2017-05-18 NOTE — Discharge Summary (Signed)
Physician Discharge Summary  Hayley Collins GEX:[id number] DOB: [birth year] DOA: 05/15/2017  PCP: Nolene Ebbs, MD  Admit date: 05/15/2017 Discharge date: 05/18/2017  Admitted From: home Disposition:  Home  Recommendations for Outpatient Follow-up:  1. Follow up with PCP in 1-2 weeks 2. Hospitalists to follow-up at home.   Home Health:No Equipment/Devices:none  Discharge Condition:stable CODE STATUS:DNR Diet recommendation: Regular   Brief/Interim Summary: 46 year old female with past medical history of essential hypertension, diabetes mellitus type 2 with metastatic cancer of the ovaries presents with abdominal pain and constipation, and ED Hayley Collins was found to be fecally impacted.  Discharge Diagnoses:  Principal Problem:   AKI (acute kidney injury) ([place]) Active Problems:   Diabetes mellitus type II, non insulin dependent ([place])   Secondary malignant neoplasm of liver (HCC)   Gynecologic malignancy ([place])   Constipation   Microcytic anemia   Thrombocytosis (HCC)   Hypertension   Transaminitis  Acute kidney injury: Prerenal in etiology in the setting of losartan use, Hayley Collins was fluid hydrated and creatinine returned to baseline.  Goals of care discussion: This metastatic ovarian cancer to the liver in April 2018 Hayley Collins decided not to proceed with chemotherapy. Hayley Collins is not a surgical candidate due to metastatic disease. Patient refuse palliative care, but Hayley Collins did agree to hospice options.  Fecal impactation: CT scan of the abdomen and pelvis did not show any obstruction. Hayley Collins was started on MiraLAX), Relistor sorbitol Hayley Collins had to be manually disimpacted. Hayley Collins had several bowel movements but her hospital stay her last 1 was watery. Hayley Collins will continue to MiraLAX at home, and is likely due to narcotic use.  Essential hypertension: Stable no changes made.  Diabetes mellitus type II, non insulin dependent (HCC) Continue hold metformin, blood glucose seems to be in good control continue  sliding scale insulin.  Secondary malignant neoplasm of liver (HCC)  Microcytic anemia: No active bleeding hematoma stable.  Mild transaminitis/severe Hepatomegaly: In the setting of liver metastases.    Discharge Instructions  Discharge Instructions    Diet - low sodium heart healthy    Complete by:  As directed    Increase activity slowly    Complete by:  As directed      Allergies as of 05/18/2017   No Known Allergies     Medication List    STOP taking these medications   losartan 50 MG tablet Commonly known as:  COZAAR     TAKE these medications   albuterol 108 (90 Base) MCG/ACT inhaler Commonly known as:  PROVENTIL HFA;VENTOLIN HFA Inhale 1-2 puffs into the lungs every 6 (six) hours as needed for wheezing.   beclomethasone 80 MCG/ACT inhaler Commonly known as:  QVAR Inhale 2 puffs into the lungs 2 (two) times daily as needed (asthma).   docusate sodium 100 MG capsule Commonly known as:  COLACE Take 1 capsule (100 mg total) by mouth 2 (two) times daily.   Lidocaine 2 % Gel Apply 1 application topically 2 (two) times daily as needed (bowel movement pain).   metFORMIN 1000 MG tablet Commonly known as:  GLUCOPHAGE Take 1,000 mg by mouth daily with breakfast.   ondansetron 4 MG tablet Commonly known as:  ZOFRAN Take 1 tablet (4 mg total) by mouth every 6 (six) hours.   oxyCODONE 5 MG immediate release tablet Commonly known as:  ROXICODONE Take 2 tablets (10 mg total) by mouth every 4 (four) hours as needed for severe pain.   polyethylene glycol packet Commonly known as:  MIRALAX / GLYCOLAX Take  17 g by mouth daily.     ASK your doctor about these medications   magnesium citrate Soln Take 296 mLs (1 Bottle total) by mouth once. Ask about: Should I take this medication?      Follow-up Information    Schedule an appointment as soon as possible for a visit  with Nolene Ebbs, MD.   Specialty:  Internal Medicine Contact information: [address] [place] [address] [phone]          No Known Allergies  Consultations:  None   Procedures/Studies: Ct Abdomen Pelvis Wo Contrast  Result Date: 05/15/2017 CLINICAL DATA:  46 y/o female with metastatic adenocarcinoma, GYN origin suspected on pathology analysis of liver biopsy. Pain with no bowel movement for 3 weeks. Jaundice. Renal insufficiency. EXAM: CT ABDOMEN AND PELVIS WITHOUT CONTRAST TECHNIQUE: Multidetector CT imaging of the abdomen and pelvis was performed following the standard protocol without IV contrast. COMPARISON:  CT Abdomen and Pelvis 04/16/2017, and earlier FINDINGS: Lower chest: Lung bases are stable an negative. No pericardial or pleural effusion. Hepatobiliary: Moderate to severe hepatomegaly (liver greater than 33 cm in length) with widespread hepatic metastases which were better demonstrated with the use of IV contrast on the prior studies. Trace perihepatic free fluid along the right lobe (series 2, image 52). Grossly negative gallbladder. Pancreas: No biliary ductal enlargement is evident. Negative noncontrast pancreas. Spleen: Negative noncontrast spleen. Adrenals/Urinary Tract: Negative noncontrast adrenal glands and kidneys. No hydronephrosis. No hydroureter is evident, although the course of both ureters is difficult to delineate. Moderately dilated but otherwise unremarkable urinary bladder. Stomach/Bowel: Moderate volume of retained stool in the colon. Regional mass effect including on the bowel from the enlarged liver and uterus/adnexa. This is most pronounced at the hepatic flexure where the colon is relatively compressed between the liver and kidney (series 2, image 55). No dilated small bowel. Negative stomach. No definite mesenteric free fluid. No free air. Vascular/Lymphatic: Vascular patency is not evaluated in the absence of IV contrast. No lymphadenopathy is evident in the noncontrast abdomen or pelvis. Reproductive: Lobulated uterine and  right adnexal region soft tissue masses re- demonstrated. Noncontrast appearance is stable compared to 04/16/2017. See series 2, image 66 and 74. There is some regional mass effect noted, including on the sigmoid colon, although this is relatively mild. No distal large bowel compression is identified. Other: No pelvic free fluid. Musculoskeletal: No acute or suspicious osseous lesion identified. IMPRESSION: 1. Moderate to severe hepatomegaly with extensive liver metastases. Lobulated uterine and right adnexal enlargement appear stable since 04/16/2017. 2. Up to moderate retained stool in the colon but no evidence of mechanical bowel obstruction, although there is some mass effect on the large bowel related to #1 - most pronounced at the hepatic flexure. 3. No new metastatic disease identified in the absence of IV contrast. Electronically Signed   By: Genevie Hayley M.D.   On: 05/15/2017 21:13   Dg Chest 2 View  Result Date: 04/30/2017 CLINICAL DATA:  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 Jaundice. EXAM: CHEST  2 VIEW COMPARISON:  03/16/2017 CXR, chest CT 03/19/2017 FINDINGS: The heart size and mediastinal contours are within normal limits. Both lungs are clear. Chronic mild elevation of right hemidiaphragm. No acute nor suspicious osseous lesions. Osteophytes noted off the inferior glenoid rim bilaterally. IMPRESSION: No active cardiopulmonary disease. Electronically Signed   By: Ashley Royalty M.D.   On: 04/30/2017 01:11     Subjective:  Hayley Collins relates Hayley Collins has several bowel movements. Hayley Collins continues to refuse her narcotics.  Discharge Exam: Vitals:   05/17/17 2032 05/18/17 0444  BP: 105/64 133/86  Pulse: 70 86  Resp: 18 18  Temp: 98.2 F (36.8 C) 98.4 F (36.9 C)   Vitals:   05/17/17 0446 05/17/17 1435 05/17/17 2032 05/18/17 0444  BP: 118/76 123/80 105/64 133/86  Pulse: 87 75 70 86  Resp: 16 16 18 18   Temp: 98.9 F (37.2 C) 98.5 F (36.9 C) 98.2  F (36.8 C) 98.4 F (36.9 C)  TempSrc: Oral Oral Oral Oral  SpO2: 99% 100% 99% 98%  Weight:    68.9 kg (151 lb 14.4 oz)  Height:        General: Hayley Collins is cachectic, patient is awake alert and oriented. Cardiovascular: Regular rate and rhythm with positive S1-S2. Respiratory: Good air movement and clear to auscultation. Abdominal: Positive bowel sounds soft nontender nondistended. Extremities: No lower extremity edema.    The results of significant diagnostics from this hospitalization (including imaging, microbiology, ancillary and laboratory) are listed below for reference.     Microbiology: No results found for this or any previous visit (from the past 240 hour(s)).   Labs: BNP (last 3 results) No results for input(s): BNP in the last 8760 hours. Basic Metabolic Panel:  Recent Labs Lab 05/15/17 2000 05/15/17 2015 05/16/17 0348 05/17/17 0345 05/18/17 0357  NA 134* 137 135 137 136  K 5.1 5.2* 4.7 4.3 3.8  CL 103 108 103 109 106  CO2 18*  --  18* 17* 18*  GLUCOSE 80 80 89 74 77  BUN 42* 42* 37* 28* 22*  CREATININE 2.31* 2.70* 2.06* 1.51* 1.29*  CALCIUM 9.6  --  9.5 9.2 9.4  MG  --   --   --  1.9  --    Liver Function Tests:  Recent Labs Lab 05/15/17 2000 05/16/17 0348  AST 109* 112*  ALT 46 43  ALKPHOS 628* 627*  BILITOT 13.9* 14.3*  PROT 7.2 7.1  ALBUMIN 2.1* 2.0*    Recent Labs Lab 05/15/17 1447  LIPASE 82*   No results for input(s): AMMONIA in the last 168 hours. CBC:  Recent Labs Lab 05/15/17 1318 05/15/17 2015 05/16/17 0348 05/17/17 0345 05/18/17 0357  WBC 18.1*  --  14.8* 15.9* 15.2*  NEUTROABS 13.7*  --  11.1*  --   --   HGB 8.7* 8.8* 8.4* 7.4* 7.8*  HCT 26.8* 26.0* 26.8* 24.4* 25.5*  MCV 74.2*  --  75.9* 76.7* 77.0*  PLT 742*  --  418* 457* 469*   Cardiac Enzymes: No results for input(s): CKTOTAL, CKMB, CKMBINDEX, TROPONINI in the last 168 hours. BNP: Invalid input(s): POCBNP CBG:  Recent Labs Lab 05/16/17 0741  05/16/17 1211 05/16/17 1713 05/16/17 1954 05/18/17 0332  GLUCAP 87 115* 108* 111* 78   D-Dimer No results for input(s): DDIMER in the last 72 hours. Hgb A1c No results for input(s): HGBA1C in the last 72 hours. Lipid Profile No results for input(s): CHOL, HDL, LDLCALC, TRIG, CHOLHDL, LDLDIRECT in the last 72 hours. Thyroid function studies No results for input(s): TSH, T4TOTAL, T3FREE, THYROIDAB in the last 72 hours.  Invalid input(s): FREET3 Anemia work up No results for input(s): VITAMINB12, FOLATE, FERRITIN, TIBC, IRON, RETICCTPCT in the last 72 hours. Urinalysis    Component Value Date/Time   COLORURINE AMBER (A) 04/16/2017 1245   APPEARANCEUR CLEAR 04/16/2017 1245   LABSPEC 1.023 04/16/2017 1245   PHURINE 5.0 04/16/2017 1245  GLUCOSEU 50 (A) 04/16/2017 1245   HGBUR NEGATIVE 04/16/2017 1245   BILIRUBINUR MODERATE (A) 04/16/2017 1245   KETONESUR NEGATIVE 04/16/2017 1245   PROTEINUR NEGATIVE 04/16/2017 1245   UROBILINOGEN 0.2 05/10/2013 1631   NITRITE NEGATIVE 04/16/2017 1245   LEUKOCYTESUR NEGATIVE 04/16/2017 1245   Sepsis Labs Invalid input(s): PROCALCITONIN,  WBC,  LACTICIDVEN Microbiology No results found for this or any previous visit (from the past 240 hour(s)).   Time coordinating discharge: Over 30 minutes  SIGNED:   Charlynne Cousins, MD  [facility] 05/18/2017, 8:48 AM Pager   If 7PM-7AM, please contact night-coverage [email or web address] Password [email or web address]

## 2017-06-29 ENCOUNTER — Encounter (HOSPITAL_COMMUNITY): Payer: Self-pay | Admitting: Emergency Medicine

## 2017-06-29 ENCOUNTER — Inpatient Hospital Stay (HOSPITAL_COMMUNITY)

## 2017-06-29 ENCOUNTER — Inpatient Hospital Stay (HOSPITAL_COMMUNITY)
Admission: EM | Admit: 2017-06-29 | Discharge: 2017-07-16 | DRG: 682 | Disposition: E | Attending: Nephrology | Admitting: Nephrology

## 2017-06-29 ENCOUNTER — Emergency Department (HOSPITAL_COMMUNITY)

## 2017-06-29 DIAGNOSIS — I959 Hypotension, unspecified: Secondary | ICD-10-CM | POA: Diagnosis present

## 2017-06-29 DIAGNOSIS — Z7189 Other specified counseling: Secondary | ICD-10-CM

## 2017-06-29 DIAGNOSIS — R531 Weakness: Secondary | ICD-10-CM | POA: Diagnosis present

## 2017-06-29 DIAGNOSIS — E43 Unspecified severe protein-calorie malnutrition: Secondary | ICD-10-CM | POA: Diagnosis present

## 2017-06-29 DIAGNOSIS — E872 Acidosis: Secondary | ICD-10-CM | POA: Diagnosis present

## 2017-06-29 DIAGNOSIS — Z8249 Family history of ischemic heart disease and other diseases of the circulatory system: Secondary | ICD-10-CM | POA: Diagnosis not present

## 2017-06-29 DIAGNOSIS — E11649 Type 2 diabetes mellitus with hypoglycemia without coma: Secondary | ICD-10-CM | POA: Diagnosis present

## 2017-06-29 DIAGNOSIS — Z7951 Long term (current) use of inhaled steroids: Secondary | ICD-10-CM | POA: Diagnosis not present

## 2017-06-29 DIAGNOSIS — K760 Fatty (change of) liver, not elsewhere classified: Secondary | ICD-10-CM | POA: Diagnosis present

## 2017-06-29 DIAGNOSIS — C561 Malignant neoplasm of right ovary: Secondary | ICD-10-CM | POA: Diagnosis present

## 2017-06-29 DIAGNOSIS — E860 Dehydration: Secondary | ICD-10-CM | POA: Diagnosis present

## 2017-06-29 DIAGNOSIS — C787 Secondary malignant neoplasm of liver and intrahepatic bile duct: Secondary | ICD-10-CM | POA: Diagnosis present

## 2017-06-29 DIAGNOSIS — A419 Sepsis, unspecified organism: Secondary | ICD-10-CM | POA: Diagnosis not present

## 2017-06-29 DIAGNOSIS — Z6825 Body mass index (BMI) 25.0-25.9, adult: Secondary | ICD-10-CM | POA: Diagnosis not present

## 2017-06-29 DIAGNOSIS — F17290 Nicotine dependence, other tobacco product, uncomplicated: Secondary | ICD-10-CM | POA: Diagnosis present

## 2017-06-29 DIAGNOSIS — Z66 Do not resuscitate: Secondary | ICD-10-CM | POA: Diagnosis not present

## 2017-06-29 DIAGNOSIS — R64 Cachexia: Secondary | ICD-10-CM | POA: Diagnosis present

## 2017-06-29 DIAGNOSIS — R140 Abdominal distension (gaseous): Secondary | ICD-10-CM | POA: Diagnosis present

## 2017-06-29 DIAGNOSIS — R740 Nonspecific elevation of levels of transaminase and lactic acid dehydrogenase [LDH]: Secondary | ICD-10-CM

## 2017-06-29 DIAGNOSIS — Z833 Family history of diabetes mellitus: Secondary | ICD-10-CM

## 2017-06-29 DIAGNOSIS — D630 Anemia in neoplastic disease: Secondary | ICD-10-CM | POA: Diagnosis present

## 2017-06-29 DIAGNOSIS — R52 Pain, unspecified: Secondary | ICD-10-CM | POA: Diagnosis not present

## 2017-06-29 DIAGNOSIS — E875 Hyperkalemia: Secondary | ICD-10-CM | POA: Diagnosis present

## 2017-06-29 DIAGNOSIS — C569 Malignant neoplasm of unspecified ovary: Secondary | ICD-10-CM | POA: Diagnosis present

## 2017-06-29 DIAGNOSIS — Z515 Encounter for palliative care: Secondary | ICD-10-CM | POA: Diagnosis not present

## 2017-06-29 DIAGNOSIS — E162 Hypoglycemia, unspecified: Secondary | ICD-10-CM | POA: Diagnosis present

## 2017-06-29 DIAGNOSIS — R7401 Elevation of levels of liver transaminase levels: Secondary | ICD-10-CM | POA: Diagnosis present

## 2017-06-29 DIAGNOSIS — E119 Type 2 diabetes mellitus without complications: Secondary | ICD-10-CM | POA: Diagnosis not present

## 2017-06-29 DIAGNOSIS — Z7984 Long term (current) use of oral hypoglycemic drugs: Secondary | ICD-10-CM | POA: Diagnosis not present

## 2017-06-29 DIAGNOSIS — N179 Acute kidney failure, unspecified: Principal | ICD-10-CM | POA: Diagnosis present

## 2017-06-29 DIAGNOSIS — R197 Diarrhea, unspecified: Secondary | ICD-10-CM | POA: Diagnosis present

## 2017-06-29 DIAGNOSIS — K7290 Hepatic failure, unspecified without coma: Secondary | ICD-10-CM | POA: Diagnosis present

## 2017-06-29 DIAGNOSIS — E871 Hypo-osmolality and hyponatremia: Secondary | ICD-10-CM | POA: Diagnosis present

## 2017-06-29 DIAGNOSIS — R160 Hepatomegaly, not elsewhere classified: Secondary | ICD-10-CM | POA: Diagnosis not present

## 2017-06-29 LAB — I-STAT CHEM 8, ED
BUN: 102 mg/dL — AB (ref 6–20)
CHLORIDE: 105 mmol/L (ref 101–111)
CREATININE: 6.6 mg/dL — AB (ref 0.44–1.00)
Calcium, Ion: 1.05 mmol/L — ABNORMAL LOW (ref 1.15–1.40)
Glucose, Bld: 82 mg/dL (ref 65–99)
HEMATOCRIT: 29 % — AB (ref 36.0–46.0)
Hemoglobin: 9.9 g/dL — ABNORMAL LOW (ref 12.0–15.0)
Potassium: 6 mmol/L — ABNORMAL HIGH (ref 3.5–5.1)
Sodium: 131 mmol/L — ABNORMAL LOW (ref 135–145)
TCO2: 12 mmol/L — ABNORMAL LOW (ref 22–32)

## 2017-06-29 LAB — URINALYSIS, ROUTINE W REFLEX MICROSCOPIC
Glucose, UA: NEGATIVE mg/dL
Ketones, ur: NEGATIVE mg/dL
Leukocytes, UA: NEGATIVE
Nitrite: NEGATIVE
PROTEIN: 30 mg/dL — AB
SPECIFIC GRAVITY, URINE: 1.01 (ref 1.005–1.030)
Squamous Epithelial / LPF: NONE SEEN
pH: 5 (ref 5.0–8.0)

## 2017-06-29 LAB — HCG, QUANTITATIVE, PREGNANCY: HCG, BETA CHAIN, QUANT, S: 32 m[IU]/mL — AB (ref <5)

## 2017-06-29 LAB — COMPREHENSIVE METABOLIC PANEL
ALT: 113 U/L — ABNORMAL HIGH (ref 14–54)
AST: 358 U/L — AB (ref 15–41)
Albumin: 2 g/dL — ABNORMAL LOW (ref 3.5–5.0)
Alkaline Phosphatase: 1811 U/L — ABNORMAL HIGH (ref 38–126)
Anion gap: 21 — ABNORMAL HIGH (ref 5–15)
BILIRUBIN TOTAL: 39 mg/dL — AB (ref 0.3–1.2)
BUN: 96 mg/dL — AB (ref 6–20)
CO2: 9 mmol/L — ABNORMAL LOW (ref 22–32)
Calcium: 9.4 mg/dL (ref 8.9–10.3)
Chloride: 95 mmol/L — ABNORMAL LOW (ref 101–111)
Creatinine, Ser: 5.07 mg/dL — ABNORMAL HIGH (ref 0.44–1.00)
GFR calc Af Amer: 11 mL/min — ABNORMAL LOW (ref >60)
GFR, EST NON AFRICAN AMERICAN: 9 mL/min — AB (ref >60)
Glucose, Bld: 74 mg/dL (ref 65–99)
POTASSIUM: 5.8 mmol/L — AB (ref 3.5–5.1)
Sodium: 125 mmol/L — ABNORMAL LOW (ref 135–145)
TOTAL PROTEIN: 6 g/dL — AB (ref 6.5–8.1)

## 2017-06-29 LAB — I-STAT CG4 LACTIC ACID, ED: Lactic Acid, Venous: 5.07 mmol/L (ref 0.5–1.9)

## 2017-06-29 LAB — PROTIME-INR
INR: 1.22
PROTHROMBIN TIME: 15.3 s — AB (ref 11.4–15.2)

## 2017-06-29 LAB — CBC WITH DIFFERENTIAL/PLATELET
BASOS PCT: 1 %
Basophils Absolute: 0.2 10*3/uL — ABNORMAL HIGH (ref 0.0–0.1)
Eosinophils Absolute: 0.2 10*3/uL (ref 0.0–0.7)
Eosinophils Relative: 1 %
HEMATOCRIT: 26 % — AB (ref 36.0–46.0)
Hemoglobin: 7.7 g/dL — ABNORMAL LOW (ref 12.0–15.0)
LYMPHS ABS: 2.4 10*3/uL (ref 0.7–4.0)
Lymphocytes Relative: 13 %
MCH: 24.8 pg — AB (ref 26.0–34.0)
MCHC: 29.6 g/dL — AB (ref 30.0–36.0)
MCV: 83.6 fL (ref 78.0–100.0)
MONOS PCT: 8 %
Monocytes Absolute: 1.5 10*3/uL — ABNORMAL HIGH (ref 0.1–1.0)
NEUTROS ABS: 14.2 10*3/uL — AB (ref 1.7–7.7)
Neutrophils Relative %: 77 %
Platelets: 385 10*3/uL (ref 150–400)
RBC: 3.11 MIL/uL — AB (ref 3.87–5.11)
RDW: 18.2 % — AB (ref 11.5–15.5)
WBC: 18.5 10*3/uL — ABNORMAL HIGH (ref 4.0–10.5)

## 2017-06-29 LAB — CBG MONITORING, ED: GLUCOSE-CAPILLARY: 48 mg/dL — AB (ref 65–99)

## 2017-06-29 LAB — LACTIC ACID, PLASMA: Lactic Acid, Venous: 3.8 mmol/L (ref 0.5–1.9)

## 2017-06-29 MED ORDER — SODIUM CHLORIDE 0.9 % IV BOLUS (SEPSIS)
1000.0000 mL | Freq: Once | INTRAVENOUS | Status: AC
  Administered 2017-06-29: 1000 mL via INTRAVENOUS

## 2017-06-29 MED ORDER — DEXTROSE-NACL 5-0.9 % IV SOLN
INTRAVENOUS | Status: DC
  Administered 2017-06-29: 23:00:00 via INTRAVENOUS

## 2017-06-29 MED ORDER — VANCOMYCIN HCL IN DEXTROSE 1-5 GM/200ML-% IV SOLN
1000.0000 mg | Freq: Once | INTRAVENOUS | Status: AC
  Administered 2017-06-29: 1000 mg via INTRAVENOUS
  Filled 2017-06-29: qty 200

## 2017-06-29 MED ORDER — INSULIN ASPART 100 UNIT/ML ~~LOC~~ SOLN: 0.0000 [IU] | Freq: Every day | SUBCUTANEOUS | Status: DC

## 2017-06-29 MED ORDER — CALCIUM GLUCONATE 10 % IV SOLN
1.0000 g | Freq: Once | INTRAVENOUS | Status: AC
  Administered 2017-06-29: 1 g via INTRAVENOUS
  Filled 2017-06-29: qty 10

## 2017-06-29 MED ORDER — ZOLPIDEM TARTRATE 5 MG PO TABS: 5.0000 mg | ORAL_TABLET | Freq: Every evening | ORAL | Status: DC | PRN

## 2017-06-29 MED ORDER — PIPERACILLIN-TAZOBACTAM 3.375 G IVPB 30 MIN
3.3750 g | Freq: Once | INTRAVENOUS | Status: AC
  Administered 2017-06-29: 3.375 g via INTRAVENOUS
  Filled 2017-06-29: qty 50

## 2017-06-29 MED ORDER — BUDESONIDE 0.25 MG/2ML IN SUSP
0.2500 mg | Freq: Two times a day (BID) | RESPIRATORY_TRACT | Status: DC
  Administered 2017-06-30 – 2017-07-05 (×10): 0.25 mg via RESPIRATORY_TRACT
  Filled 2017-06-29 (×14): qty 2

## 2017-06-29 MED ORDER — ONDANSETRON HCL 4 MG/2ML IJ SOLN: 4.0000 mg | Freq: Three times a day (TID) | INTRAMUSCULAR | Status: DC | PRN

## 2017-06-29 MED ORDER — VANCOMYCIN HCL 500 MG IV SOLR
500.0000 mg | Freq: Once | INTRAVENOUS | Status: AC
  Administered 2017-06-30: 500 mg via INTRAVENOUS
  Filled 2017-06-29 (×2): qty 500

## 2017-06-29 MED ORDER — SODIUM POLYSTYRENE SULFONATE 15 GM/60ML PO SUSP
45.0000 g | Freq: Once | ORAL | Status: AC
  Administered 2017-06-29: 45 g via ORAL
  Filled 2017-06-29: qty 180

## 2017-06-29 MED ORDER — ALBUTEROL SULFATE (2.5 MG/3ML) 0.083% IN NEBU: 2.5000 mg | INHALATION_SOLUTION | Freq: Four times a day (QID) | RESPIRATORY_TRACT | Status: DC | PRN

## 2017-06-29 MED ORDER — SODIUM CHLORIDE 0.9 % IV SOLN: INTRAVENOUS | Status: DC

## 2017-06-29 MED ORDER — HEPARIN SODIUM (PORCINE) 5000 UNIT/ML IJ SOLN
5000.0000 [IU] | Freq: Three times a day (TID) | INTRAMUSCULAR | Status: DC
  Administered 2017-06-30: 5000 [IU] via SUBCUTANEOUS
  Filled 2017-06-29 (×3): qty 1

## 2017-06-29 MED ORDER — DEXTROSE 50 % IV SOLN
INTRAVENOUS | Status: AC
  Administered 2017-06-29: 50 mL
  Filled 2017-06-29: qty 50

## 2017-06-29 MED ORDER — PIPERACILLIN-TAZOBACTAM IN DEX 2-0.25 GM/50ML IV SOLN
2.2500 g | Freq: Four times a day (QID) | INTRAVENOUS | Status: DC
  Administered 2017-06-30 – 2017-07-02 (×9): 2.25 g via INTRAVENOUS
  Filled 2017-06-29 (×13): qty 50

## 2017-06-29 MED ORDER — NICOTINE 21 MG/24HR TD PT24: 21.0000 mg | MEDICATED_PATCH | Freq: Every day | TRANSDERMAL | Status: DC

## 2017-06-29 MED ORDER — MORPHINE SULFATE (PF) 2 MG/ML IV SOLN: 2.0000 mg | INTRAVENOUS | Status: DC | PRN

## 2017-06-29 MED ORDER — OXYCODONE HCL 5 MG PO TABS: 10.0000 mg | ORAL_TABLET | ORAL | Status: DC | PRN

## 2017-06-29 MED ORDER — ONDANSETRON HCL 4 MG/2ML IJ SOLN
4.0000 mg | Freq: Once | INTRAMUSCULAR | Status: AC
  Administered 2017-06-29: 4 mg via INTRAVENOUS
  Filled 2017-06-29: qty 2

## 2017-06-29 MED ORDER — INSULIN ASPART 100 UNIT/ML ~~LOC~~ SOLN: 0.0000 [IU] | Freq: Three times a day (TID) | SUBCUTANEOUS | Status: DC

## 2017-06-29 MED ORDER — BECLOMETHASONE DIPROPIONATE 80 MCG/ACT IN AERS: 2.0000 | INHALATION_SPRAY | Freq: Two times a day (BID) | RESPIRATORY_TRACT | Status: DC | PRN

## 2017-06-29 NOTE — ED Provider Notes (Signed)
[facility] DEPT Provider Note   CSN: [id number] Arrival date & time: 07/02/2017  1636     History   Chief Complaint Chief Complaint  Patient presents with  . Weakness    HPI APHRODITE HARPENAU is a 46 y.o. female.  HPI   46 yo F with PMhx as below, most notable for severe metastatic ovarian CA, on hospice, here with weakness. History limited 2/2 generalized fatigue, weakness but pt reports that for the past 2-3 days she has felt very fatigued. She has not had much of an appetite due to nausea and has not been eating/drinking. She has also had loose bowel movements but no worsening of her abdominal pain, beyond her chronic, diffuse abd pain 2/2 her cancer. No fevers. No urinary sx, though she has been urinating less frequently. No headache, numbness, or weakness. She felt progressively more and more weak so asked husband to be transported to ED. No CP, cough, or SOB.  Past Medical History:  Diagnosis Date  . Cancer ([place])   . Diabetes mellitus   . Fatty liver   . GERD (gastroesophageal reflux disease)   . Hypertension     Patient Active Problem List   Diagnosis Date Noted  . Sepsis ([place]) 07/13/2017  . Dehydration 07/02/2017  . Hyperkalemia 07/11/2017  . Hypotension 07/05/2017  . Transaminitis 05/17/2017  . AKI (acute kidney injury) ([place]) 05/15/2017  . Constipation 05/15/2017  . Microcytic anemia 05/15/2017  . Thrombocytosis ([place]) 05/15/2017  . Hypertension 05/15/2017  . 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 Gynecologic malignancy ([place]) 01/16/2017  . Ovarian mass, right 12/26/2016  . Enlarged uterus 12/26/2016  . Liver masses 12/26/2016  . Diabetes mellitus type II, non insulin dependent ([place]) 12/26/2016    Past Surgical History:  Procedure Laterality Date  . DENTAL SURGERY    . FOOT SURGERY      OB History    No data available       Home Medications    Prior to Admission medications   Medication Sig Start Date End Date Taking?  Authorizing Provider  albuterol (PROVENTIL HFA;VENTOLIN HFA) 108 (90 Base) MCG/ACT inhaler Inhale 1-2 puffs into the lungs every 6 (six) hours as needed for wheezing. 03/16/17  Yes Tanna Furry, MD  beclomethasone (QVAR) 80 MCG/ACT inhaler Inhale 2 puffs into the lungs 2 (two) times daily as needed (asthma).    Yes [provider]  docusate sodium (COLACE) 100 MG capsule Take 1 capsule (100 mg total) by mouth 2 (two) times daily. 05/15/17  Yes Gareth Morgan, MD  metFORMIN (GLUCOPHAGE) 1000 MG tablet Take 1,000 mg by mouth daily with breakfast.    Yes [provider]  ondansetron (ZOFRAN) 4 MG tablet Take 1 tablet (4 mg total) by mouth every 6 (six) hours. 05/18/17  Yes Charlynne Cousins, MD  oxyCODONE (ROXICODONE) 5 MG immediate release tablet Take 2 tablets (10 mg total) by mouth every 4 (four) hours as needed for severe pain. 05/18/17  Yes Charlynne Cousins, MD  polyethylene glycol [facility] / Floria Raveling) packet Take 17 g by mouth daily. 05/18/17  Yes Charlynne Cousins, MD  Lidocaine 2 % GEL Apply 1 application topically 2 (two) times daily as needed (bowel movement pain). Patient not taking: Reported on 06/26/2017 05/15/17   Gareth Morgan, MD    Family History Family History  Problem Relation Age of Onset  . Hypertension Other   . Diabetes Other     Social History Social History  Substance Use  Topics  . Smoking status: Current Every Day Smoker    Types: E-cigarettes  . Smokeless tobacco: Never Used  . Alcohol use No     Allergies   Patient has no known allergies.   Review of Systems Review of Systems  Constitutional: Positive for appetite change and fatigue.  Gastrointestinal: Positive for diarrhea and nausea.  Neurological: Positive for weakness and light-headedness.  All other systems reviewed and are negative.    Physical Exam Updated Vital Signs BP 106/60   Pulse 82   Temp 98.1 F (36.7 C) (Oral)   Resp 20   SpO2 100%   Physical Exam    Constitutional: She is oriented to person, place, and time. She appears well-developed and well-nourished.  Cachectic, chronically ill-appearing  HENT:  Head: Normocephalic and atraumatic.  Markedly dry MM  Eyes: Conjunctivae are normal. Scleral icterus (severe bilateral icterus) is present.  Neck: Neck supple.  Cardiovascular: Normal rate, regular rhythm and normal heart sounds.  Exam reveals no friction rub.   No murmur heard. Pulmonary/Chest: Effort normal and breath sounds normal. No respiratory distress. She has no wheezes. She has no rales.  Abdominal: Soft. She exhibits distension. There is tenderness (generalized).  Musculoskeletal: She exhibits no edema.  Neurological: She is alert and oriented to person, place, and time. She exhibits normal muscle tone.  Skin: Skin is warm. Capillary refill takes 2 to 3 seconds. No rash noted.  Psychiatric: She has a normal mood and affect.  Nursing note and vitals reviewed.    ED Treatments / Results  Labs (all labs ordered are listed, but only abnormal results are displayed) Labs Reviewed  CBC WITH DIFFERENTIAL/PLATELET - Abnormal; Notable for the following:       Result Value   WBC 18.5 (*)    RBC 3.11 (*)    Hemoglobin 7.7 (*)    HCT 26.0 (*)    MCH 24.8 (*)    MCHC 29.6 (*)    RDW 18.2 (*)    Neutro Abs 14.2 (*)    Monocytes Absolute 1.5 (*)    Basophils Absolute 0.2 (*)    All other components within normal limits  COMPREHENSIVE METABOLIC PANEL - Abnormal; Notable for the following:    Sodium 125 (*)    Potassium 5.8 (*)    Chloride 95 (*)    CO2 9 (*)    BUN 96 (*)    Creatinine, Ser 5.07 (*)    Total Protein 6.0 (*)    Albumin 2.0 (*)    AST 358 (*)    ALT 113 (*)    Alkaline Phosphatase 1,811 (*)    Total Bilirubin 39.0 (*)    GFR calc non Af Amer 9 (*)    GFR calc Af Amer 11 (*)    Anion gap 21 (*)    All other components within normal limits  URINALYSIS, ROUTINE W REFLEX MICROSCOPIC - Abnormal; Notable for  the following:    Color, Urine AMBER (*)    APPearance HAZY (*)    Hgb urine dipstick MODERATE (*)    Bilirubin Urine MODERATE (*)    Protein, ur 30 (*)    Bacteria, UA MANY (*)    All other components within normal limits  LACTIC ACID, PLASMA - Abnormal; Notable for the following:    Lactic Acid, Venous 3.8 (*)    All other components within normal limits  PROTIME-INR - Abnormal; Notable for the following:    Prothrombin Time 15.3 (*)  All other components within normal limits  I-STAT CG4 LACTIC ACID, ED - Abnormal; Notable for the following:    Lactic Acid, Venous 5.07 (*)    All other components within normal limits  I-STAT CHEM 8, ED - Abnormal; Notable for the following:    Sodium 131 (*)    Potassium 6.0 (*)    BUN 102 (*)    Creatinine, Ser 6.60 (*)    Calcium, Ion 1.05 (*)    TCO2 12 (*)    Hemoglobin 9.9 (*)    HCT 29.0 (*)    All other components within normal limits  CBG MONITORING, ED - Abnormal; Notable for the following:    Glucose-Capillary 48 (*)    All other components within normal limits  CULTURE, BLOOD (ROUTINE X 2)  CULTURE, BLOOD (ROUTINE X 2)  URINE CULTURE  CREATININE, URINE, RANDOM  SODIUM, URINE, RANDOM  HCG, QUANTITATIVE, PREGNANCY  LACTIC ACID, PLASMA  PROCALCITONIN  COMPREHENSIVE METABOLIC PANEL  CBC  CBG MONITORING, ED    EKG  EKG Interpretation  Date/Time:  Friday June 29 2017 18:10:24 EDT Ventricular Rate:  79 PR Interval:    QRS Duration: 99 QT Interval:  397 QTC Calculation: 456 R Axis:   77 Text Interpretation:  Sinus rhythm No significant change since last tracing Confirmed by Duffy Bruce [phone]) on 06/16/2017 6:51:39 PM       Radiology US Renal  Result Date: 06/19/2017 CLINICAL DATA:  Initial evaluation for acute renal injury. EXAM: RENAL / URINARY TRACT ULTRASOUND COMPLETE COMPARISON:  Prior CT from 05/15/2017. FINDINGS: Right Kidney: Length: 14.3 cm. Increased echogenicity within the renal parenchyma. No  mass or hydronephrosis visualized. Left Kidney: Length: 13.6 cm. Increased echogenicity within the renal parenchyma. No mass or hydronephrosis visualized. Bladder: Appears normal for degree of bladder distention. Trace free fluid noted within the abdomen. IMPRESSION: 1. Increased echogenicity within the renal parenchyma bilaterally, compatible with medical renal disease. No hydronephrosis. 2. Trace free fluid within the abdomen. Electronically Signed   By: Jeannine Boga M.D.   On: 06/17/2017 21:31   Dg Chest Port 1 View  Result Date: 07/06/2017 CLINICAL DATA:  Weakness and poor appetite. Shortness of breath over the last 2 days. EXAM: PORTABLE CHEST 1 VIEW COMPARISON:  04/30/2017 FINDINGS: Poor inspiration. Heart size is normal. Mediastinal shadows normal. The lungs are clear. The vascularity is normal. No effusions. No acute bone finding. IMPRESSION: Poor inspiration.  Allowing for that, no active disease suspected. Electronically Signed   By: Nelson Chimes M.D.   On: 06/23/2017 17:58    Procedures Procedures (including critical care time)  Medications Ordered in ED Medications  ondansetron (ZOFRAN) injection 4 mg (not administered)  oxyCODONE (Oxy IR/ROXICODONE) immediate release tablet 10 mg (not administered)  albuterol (PROVENTIL) (2.5 MG/3ML) 0.083% nebulizer solution 2.5 mg (not administered)  beclomethasone (QVAR) 80 MCG/ACT inhaler 2 puff (not administered)  morphine 2 MG/ML injection 2 mg (not administered)  heparin injection 5,000 Units (not administered)  zolpidem (AMBIEN) tablet 5 mg (not administered)  vancomycin (VANCOCIN) 500 mg in sodium chloride 0.9 % 100 mL IVPB (not administered)  piperacillin-tazobactam (ZOSYN) IVPB 2.25 g (not administered)  dextrose 5 %-0.9 % sodium chloride infusion ( Intravenous New Bag/Given 07/10/2017 2320)  sodium chloride 0.9 % bolus 1,000 mL (not administered)  sodium chloride 0.9 % bolus 1,000 mL (0 mLs Intravenous Stopped 06/24/2017 2009)    ondansetron (ZOFRAN) injection 4 mg (4 mg Intravenous Given 06/23/2017 1725)  piperacillin-tazobactam (ZOSYN) IVPB 3.375 g (0 g  Intravenous Stopped 06/20/2017 1851)  vancomycin (VANCOCIN) IVPB 1000 mg/200 mL premix (0 mg Intravenous Stopped 07/10/2017 1937)  calcium gluconate 1 g in sodium chloride 0.9 % 100 mL IVPB (0 g Intravenous Stopped 06/24/2017 1847)  sodium chloride 0.9 % bolus 1,000 mL (0 mLs Intravenous Stopped 07/06/2017 2113)  sodium chloride 0.9 % bolus 1,000 mL (0 mLs Intravenous Stopped 06/19/2017 2010)  sodium polystyrene (KAYEXALATE) 15 GM/60ML suspension 45 g (45 g Oral Given 07/14/2017 2104)  dextrose 50 % solution (50 mLs  Given 07/04/2017 2320)     Initial Impression / Assessment and Plan / ED Course  I have reviewed the triage vital signs and the nursing notes.  Pertinent labs & imaging results that were available during my care of the patient were reviewed by me and considered in my medical decision making (see chart for details).    46 year old female with past medical history of metastatic ovarian cancer here with generalized weakness in the setting of decreased by mouth intake. On exam, the patient is markedly jaundiced, dehydrated, and chronically ill-appearing but nontoxic. Labwork shows acute renal failure with likely prerenal etiology. Her LFTs and alkaline phosphatase, as well as bilirubin are all markedly elevated, which is likely due to progression of her liver metastases and underlying cancer. She has hospice but would be accepting of fluids. Initially, patient activated as a code sepsis given her markedly lactic acidosis, although on further lab work and based on history provided by family, suspect this is more so secondary to her liver disease and dehydration.  I had a long discussion with the patient and caregiver. Patient states she feels very unwell but is improving with fluids. We discussed discharge home with encourage fluids, versus admission. I discussed that if patient  returned home and did not eat or drink, her prognosis would likely be very poor, over the next several days. Patient states that she feels better with fluids and would be amenable to admission. She would not desire dialysis or other aggressive measures.  Final Clinical Impressions(s) / ED Diagnoses   Final diagnoses:  Dehydration  AKI (acute kidney injury) ([place])  Hyperkalemia    New Prescriptions New Prescriptions   No medications on file     Duffy Bruce, MD 06/30/17 1159

## 2017-06-29 NOTE — ED Notes (Signed)
Ultrasound is in the room the nurse will collect the blood when they finish

## 2017-06-29 NOTE — Progress Notes (Signed)
Pharmacy Antibiotic Note  Hayley Collins is a 46 y.o. female admitted on 07/04/2017 with sepsis.  Pharmacy has been consulted for vancomycin and piperacillin/tazobactam dosing.  Patient with advanced ovarian cancer, she complains of weakness and inability to eat for 2-3 days.  Her labs reveal AKI at time of admission  Today, 06/23/2017  Renal: Scr = 6.60 (earlier this month SCr was 1.29-1.5)  LFTs significantly elevated  WBC elevated  Afebrile  SIRS: only meets increase WBC (this may be due to dehydration)  Plan:  Zosyn 2.25gm IV q6h (adjust to q8h if oliguric)  Give additional vancomycin 500mg  IV x 1 to make 1500mg  loading dose and plan for random vancomycin level 9/16 am  Check daily SCr due to increase risk of nephrotoxicity with above regimen  Follow-up ability to stop antibiotics as it is questionable if infection cause of presenting symptoms    Temp (24hrs), Avg:98.1 F (36.7 C), Min:98.1 F (36.7 C), Max:98.1 F (36.7 C)   Recent Labs Lab 06/30/2017 1652 07/11/2017 1739 06/16/2017 1740  WBC 18.5*  --   --   CREATININE 5.07* 6.60*  --   LATICACIDVEN  --   --  5.07*    CrCl cannot be calculated (Unknown ideal weight.).    No Known Allergies  Antimicrobials this admission:  9/14 vanco >> 9/14 zosyn >>  Dose adjustments this admission:   Microbiology results:  9/14 BCx:   Thank you for allowing pharmacy to be a part of this patient's care.  Doreene Eland, PharmD, BCPS.   Pager: [phone] 07/09/2017 9:05 PM

## 2017-06-29 NOTE — ED Notes (Signed)
RN and MD made aware of elevated lactic

## 2017-06-29 NOTE — ED Triage Notes (Signed)
Per EMS pt husband request evaluation related to weakness and poor appetite over past 2 days; pt is hospice pt.

## 2017-06-29 NOTE — Progress Notes (Signed)
A consult was received from an ED physician for vancomycin and zosyn per pharmacy dosing.  The patient's profile has been reviewed for ht/wt/allergies/indication/available labs.   One time orders have been placed for vancomycin and zosyn per MD.   Further antibiotics/pharmacy consults should be ordered by admitting physician if indicated.                       Thank you,  Doreene Eland, PharmD, BCPS.   Pager: [phone] 07/06/2017 5:59 PM

## 2017-06-29 NOTE — ED Notes (Signed)
Bed: MM38 Expected date:  Expected time:  Means of arrival:  Comments: EMS- CA PT, weakness

## 2017-06-29 NOTE — ED Notes (Signed)
Date and time results received: 06/17/2017 5:46 PM  Test: Lactic Acid  Critical Value: 5.07  Name of Provider Notified: Dr. Ellender Hose  Orders Received? Or Actions Taken?: Awaiting orders

## 2017-06-29 NOTE — ED Notes (Signed)
Ultrasound at bedside

## 2017-06-29 NOTE — H&P (Addendum)
History and Physical    Hayley Collins YQI:[id number] DOB: 1971-03-29 DOA: 06/30/2017  Referring MD/NP/PA:   PCP: Nolene Ebbs, MD   Patient coming from:  The patient is coming from home.  At baseline, pt is partially dependent for most of ADL.  Chief Complaint: Generalized weakness, poor oral intake  HPI: Hayley Collins is a 46 y.o. female with medical history significant of hypertension, type 2 diabetes mellitus, and ovarian cancer with metastases, transaminases due to metastasized liver disease, GERD, who presents with generalized weakness and poor oral intake.  Patient states that she has been having poor appetite and decreased oral intake in the past several days, which has been progressively getting worse. She also has loose stool, 4 to 6 time daily (patient is taking docusate and MiraLAX). She has abdominal distention and abdominal pain. No nausea or vomiting. Patient denies symptoms of UTI. No fever or chills. Denies chest pain, cough, shortness of breath. No unilateral weakness.  ED Course: pt was found to have WBC 18.5, lactic acid of 5.07,negative urinalysis, sodium 125-->131, potassium 5.8-->6.0, acute renal injury with creatinine 5.07, abnormal liver function with ALT 1811, AST 358, ALT 113, total bilirubin 39.0. Temperature normal, no tachycardia, has tachypnea, oxygen saturation section 90-99% on room air, hypotension with blood pressure 90/46. Patient is admitted to stepdown as inpatient.  Review of Systems:   General: no fevers, chills, has poor appetite, has fatigue HEENT: no blurry vision, hearing changes or sore throat. Has icterus  Respiratory: no dyspnea, coughing, wheezing CV: no chest pain, no palpitations GI: no nausea, vomiting, constipation. Has abdominal pain, abdominal distention and loose stool.  GU: no dysuria, burning on urination, increased urinary frequency, hematuria  Ext: no leg edema Neuro: no unilateral weakness, numbness, or tingling, no vision  change or hearing loss Skin: no rash, no skin tear. MSK: No muscle spasm, no deformity, no limitation of range of movement in spin Heme: No easy bruising.  Travel history: No recent long distant travel.  Allergy: No Known Allergies  Past Medical History:  Diagnosis Date  . Cancer ([place])   . Diabetes mellitus   . Fatty liver   . GERD (gastroesophageal reflux disease)   . Hypertension     Past Surgical History:  Procedure Laterality Date  . DENTAL SURGERY    . FOOT SURGERY      Social History:  reports that she has been smoking E-cigarettes.  She has never used smokeless tobacco. She reports that she does not drink alcohol or use drugs.  Family History:  Family History  Problem Relation Age of Onset  . Hypertension Other   . Diabetes Other      Prior to Admission medications   Medication Sig Start Date End Date Taking? Authorizing Provider  albuterol (PROVENTIL HFA;VENTOLIN HFA) 108 (90 Base) MCG/ACT inhaler Inhale 1-2 puffs into the lungs every 6 (six) hours as needed for wheezing. 03/16/17  Yes Tanna Furry, MD  beclomethasone (QVAR) 80 MCG/ACT inhaler Inhale 2 puffs into the lungs 2 (two) times daily as needed (asthma).    Yes [provider]  docusate sodium (COLACE) 100 MG capsule Take 1 capsule (100 mg total) by mouth 2 (two) times daily. 05/15/17  Yes Gareth Morgan, MD  metFORMIN (GLUCOPHAGE) 1000 MG tablet Take 1,000 mg by mouth daily with breakfast.    Yes [provider]  ondansetron (ZOFRAN) 4 MG tablet Take 1 tablet (4 mg total) by mouth every 6 (six) hours. 05/18/17  Yes Charlynne Cousins,  MD  oxyCODONE (ROXICODONE) 5 MG immediate release tablet Take 2 tablets (10 mg total) by mouth every 4 (four) hours as needed for severe pain. 05/18/17  Yes Charlynne Cousins, MD  polyethylene glycol [facility] / Floria Raveling) packet Take 17 g by mouth daily. 05/18/17  Yes Charlynne Cousins, MD  Lidocaine 2 % GEL Apply 1 application topically 2 (two) times daily as  needed (bowel movement pain). Patient not taking: Reported on 06/28/2017 05/15/17   Gareth Morgan, MD    Physical Exam: Vitals:   07/13/2017 2200 06/18/2017 2209 07/14/2017 2230 07/13/2017 2302  BP: 108/69 108/69 110/65 106/60  Pulse: 87 76 81 82  Resp: 19 20 (!) 22 20  Temp:      TempSrc:      SpO2: 100% 100% 100% 100%   General: Not in acute distress HEENT:       Eyes: PERRL, EOMI, has scleral icterus.       ENT: No discharge from the ears and nose, no pharynx injection, no tonsillar enlargement.        Neck: No JVD, no bruit, no mass felt. Heme: No neck lymph node enlargement. Cardiac: S1/S2, RRR, No murmurs, No gallops or rubs. Respiratory: No rales, wheezing, rhonchi or rubs. GI:  has diffused tenderness with hepatosplenomegaly and mass. BS present. GU: No hematuria Ext: No pitting leg edema bilaterally. 2+DP/PT pulse bilaterally. Musculoskeletal: No joint deformities, No joint redness or warmth, no limitation of ROM in spin. Skin: No rashes.  Neuro: Alert, oriented X3, cranial nerves II-XII grossly intact, moves all extremities normally.  Psych: Patient is not psychotic, no suicidal or hemocidal ideation.  Labs on Admission: I have personally reviewed following labs and imaging studies  CBC:  Recent Labs Lab 06/28/2017 1652 07/04/2017 1739  WBC 18.5*  --   NEUTROABS 14.2*  --   HGB 7.7* 9.9*  HCT 26.0* 29.0*  MCV 83.6  --   PLT 385  --    Basic Metabolic Panel:  Recent Labs Lab 07/12/2017 1652 06/16/2017 1739  NA 125* 131*  K 5.8* 6.0*  CL 95* 105  CO2 9*  --   GLUCOSE 74 82  BUN 96* 102*  CREATININE 5.07* 6.60*  CALCIUM 9.4  --    GFR: CrCl cannot be calculated (Unknown ideal weight.). Liver Function Tests:  Recent Labs Lab 06/30/2017 1652  AST 358*  ALT 113*  ALKPHOS 1,811*  BILITOT 39.0*  PROT 6.0*  ALBUMIN 2.0*   No results for input(s): LIPASE, AMYLASE in the last 168 hours. No results for input(s): AMMONIA in the last 168 hours. Coagulation  Profile:  Recent Labs Lab 06/27/2017 2156  INR 1.22   Cardiac Enzymes: No results for input(s): CKTOTAL, CKMB, CKMBINDEX, TROPONINI in the last 168 hours. BNP (last 3 results) No results for input(s): PROBNP in the last 8760 hours. HbA1C: No results for input(s): HGBA1C in the last 72 hours. CBG:  Recent Labs Lab 06/18/2017 2303  GLUCAP 48*   Lipid Profile: No results for input(s): CHOL, HDL, LDLCALC, TRIG, CHOLHDL, LDLDIRECT in the last 72 hours. Thyroid Function Tests: No results for input(s): TSH, T4TOTAL, FREET4, T3FREE, THYROIDAB in the last 72 hours. Anemia Panel: No results for input(s): VITAMINB12, FOLATE, FERRITIN, TIBC, IRON, RETICCTPCT in the last 72 hours. Urine analysis:    Component Value Date/Time   COLORURINE AMBER (A) 06/26/2017 2110   APPEARANCEUR HAZY (A) 06/16/2017 2110   LABSPEC 1.010 06/24/2017 2110   PHURINE 5.0 06/21/2017 2110   GLUCOSEU NEGATIVE 07/14/2017  2110   HGBUR MODERATE (A) 06/23/2017 2110   BILIRUBINUR MODERATE (A) 06/16/2017 2110   KETONESUR NEGATIVE 06/20/2017 2110   PROTEINUR 30 (A) 06/26/2017 2110   UROBILINOGEN 0.2 05/10/2013 1631   NITRITE NEGATIVE 07/09/2017 2110   LEUKOCYTESUR NEGATIVE 06/22/2017 2110   Sepsis Labs: @LABRCNTIP (procalcitonin:4,lacticidven:4) )No results found for this or any previous visit (from the past 240 hour(s)).   Radiological Exams on Admission: US Renal  Result Date: 07/04/2017 CLINICAL DATA:  Initial evaluation for acute renal injury. EXAM: RENAL / URINARY TRACT ULTRASOUND COMPLETE COMPARISON:  Prior CT from 05/15/2017. FINDINGS: Right Kidney: Length: 14.3 cm. Increased echogenicity within the renal parenchyma. No mass or hydronephrosis visualized. Left Kidney: Length: 13.6 cm. Increased echogenicity within the renal parenchyma. No mass or hydronephrosis visualized. Bladder: Appears normal for degree of bladder distention. Trace free fluid noted within the abdomen. IMPRESSION: 1. Increased echogenicity  within the renal parenchyma bilaterally, compatible with medical renal disease. No hydronephrosis. 2. Trace free fluid within the abdomen. Electronically Signed   By: Jeannine Boga M.D.   On: 06/19/2017 21:31   Dg Chest Port 1 View  Result Date: 06/24/2017 CLINICAL DATA:  Weakness and poor appetite. Shortness of breath over the last 2 days. EXAM: PORTABLE CHEST 1 VIEW COMPARISON:  04/30/2017 FINDINGS: Poor inspiration. Heart size is normal. Mediastinal shadows normal. The lungs are clear. The vascularity is normal. No effusions. No acute bone finding. IMPRESSION: Poor inspiration.  Allowing for that, no active disease suspected. Electronically Signed   By: Nelson Chimes M.D.   On: 06/25/2017 17:58     EKG: Independently reviewed.  Low voltage, QTC 456, sinus rhythm    Assessment/Plan Principal Problem:   Sepsis ([place]) Active Problems:   Liver masses   Diabetes mellitus type II, non insulin dependent (HCC)   AKI (acute kidney injury) ([place])   Transaminitis   Dehydration   Hyperkalemia   Hypotension   Hypoglycemia  Possible sepsis vs. Dehydration: Patient has hypotension, worsening renal function, elevated lactic acid, likely due to dehydration, but cannot completely rule out sepsis. Pt meets criteria for sepsis with leukocytosis, hypotension, tachypnea. Lactic acid is elevated. Source of infection is not clear. Urinalysis negative. Chest x-ray showed poor inspiration, but no infiltration.  -will admit to SDU as inpt -IV vancomycin and Zosyn were started in ED, will continue. Will follow-up blood culture and urine culture-->if negative, will need to d/c Abx quickly -will get Procalcitonin and trend lactic acid levels per sepsis protocol. -IVF: 3L of NS bolus in ED, followed by 125 cc/h of D5-NS  Acute kidney injury : pt's Cre is 5.07 and BUN 96 on admission. Likely due to prerenal secondary to dehydration. UA negative for UTI - IVF as above - Check FeNa  - Follow up renal function  by BMP - US-renal  Hyperkalemia: potassium of 5.8-6.0. No EKG change. Due to acute renal injury. -1 g of calcium gluconate was given in ED -Kayexalate 45g 1  Hx of Ovarian cancer with metastases: did not have surgery, chemotherapy or radiation therapy. Patient is not candidate for surgery. Pt has elected for no treatment. She is not following up with any oncologist. In April '18, she was given and estimated 6 months to live without treatment per gyn onc  - She agreed to meet with palliative care and consultation was requested in recent admission.  Loose stool: -will hold docusate and MiraLAX  Hx of Hypertension : no has hypotension. Not taking Bp meds now. -monitoring Bp closely  Diabetes mellitus type II, non insulin dependent  Type II DM without complications and not on insulin: Last A1c 10.8 on 12/26/16, poorly controled. Patient is taking metformin at home now patient has hypoglycemia. -hold metformin.  Hypoglycemia: Blood sugar 48. Most likely due to decreased oral intake. Since patient has large liver tumor burden, she have increased IGF secretion. -D5-NS at 125 cc/h -cbg q1h.  Anemia: Hgb is 7.7 and  Close to baseline. No bleeding evident  -f/u by CBC   Addendum: Hgb dropped to 6.7. Maybe due to dilution effect, but cannot completely r/o GIB. -2 large born IV -check FOBT -type and cross -1 unit of blood was ordered by Hayley Collins -d/c SQ Heparin-->start SCD -check CBC q6h   Transaminitis: has worsened. Due to extensive liver mets  -avoid tylenol -f/u by CMP   DVT ppx: SQ Heparin -->change to SCD  Code Status: Full code Family Communication: Yes, patient's Cousin sister at bed side Disposition Plan:  Anticipate discharge back to previous home environment Consults called:  none Admission status:  SDU/inpation       Date of Service 06/30/2017    Ivor Costa [facility] Pager [phone]  If 7PM-7AM, please contact night-coverage [email or web address] Password  [email or web address] 06/30/2017, 12:30 AM

## 2017-06-30 DIAGNOSIS — Z515 Encounter for palliative care: Secondary | ICD-10-CM

## 2017-06-30 DIAGNOSIS — E162 Hypoglycemia, unspecified: Secondary | ICD-10-CM | POA: Diagnosis present

## 2017-06-30 DIAGNOSIS — C569 Malignant neoplasm of unspecified ovary: Secondary | ICD-10-CM

## 2017-06-30 DIAGNOSIS — Z7189 Other specified counseling: Secondary | ICD-10-CM

## 2017-06-30 LAB — GLUCOSE, CAPILLARY
Glucose-Capillary: 104 mg/dL — ABNORMAL HIGH (ref 65–99)
Glucose-Capillary: 93 mg/dL (ref 65–99)
Glucose-Capillary: 106 mg/dL — ABNORMAL HIGH (ref 65–99)
Glucose-Capillary: 154 mg/dL — ABNORMAL HIGH (ref 65–99)
Glucose-Capillary: 168 mg/dL — ABNORMAL HIGH (ref 65–99)

## 2017-06-30 LAB — CBC
HEMATOCRIT: 22.5 % — AB (ref 36.0–46.0)
HEMATOCRIT: 30.2 % — AB (ref 36.0–46.0)
HEMOGLOBIN: 6.7 g/dL — AB (ref 12.0–15.0)
Hemoglobin: 9.3 g/dL — ABNORMAL LOW (ref 12.0–15.0)
MCH: 24.9 pg — ABNORMAL LOW (ref 26.0–34.0)
MCH: 26.3 pg (ref 26.0–34.0)
MCHC: 29.8 g/dL — ABNORMAL LOW (ref 30.0–36.0)
MCHC: 30.8 g/dL (ref 30.0–36.0)
MCV: 83.6 fL (ref 78.0–100.0)
MCV: 85.3 fL (ref 78.0–100.0)
PLATELETS: 319 10*3/uL (ref 150–400)
Platelets: 351 10*3/uL (ref 150–400)
RBC: 2.69 MIL/uL — AB (ref 3.87–5.11)
RBC: 3.54 MIL/uL — ABNORMAL LOW (ref 3.87–5.11)
RDW: 18.2 % — ABNORMAL HIGH (ref 11.5–15.5)
RDW: 17.9 % — AB (ref 11.5–15.5)
WBC: 18.7 10*3/uL — AB (ref 4.0–10.5)
WBC: 16.9 10*3/uL — ABNORMAL HIGH (ref 4.0–10.5)

## 2017-06-30 LAB — COMPREHENSIVE METABOLIC PANEL
ALT: 106 U/L — ABNORMAL HIGH (ref 14–54)
ANION GAP: 17 — AB (ref 5–15)
AST: 354 U/L — AB (ref 15–41)
Albumin: 1.7 g/dL — ABNORMAL LOW (ref 3.5–5.0)
Alkaline Phosphatase: 1597 U/L — ABNORMAL HIGH (ref 38–126)
BILIRUBIN TOTAL: 36.6 mg/dL — AB (ref 0.3–1.2)
BUN: 94 mg/dL — ABNORMAL HIGH (ref 6–20)
CALCIUM: 8.6 mg/dL — AB (ref 8.9–10.3)
CO2: 10 mmol/L — ABNORMAL LOW (ref 22–32)
Chloride: 104 mmol/L (ref 101–111)
Creatinine, Ser: 4.4 mg/dL — ABNORMAL HIGH (ref 0.44–1.00)
GFR calc non Af Amer: 11 mL/min — ABNORMAL LOW (ref >60)
GFR, EST AFRICAN AMERICAN: 13 mL/min — AB (ref >60)
Glucose, Bld: 100 mg/dL — ABNORMAL HIGH (ref 65–99)
POTASSIUM: 5 mmol/L (ref 3.5–5.1)
Sodium: 131 mmol/L — ABNORMAL LOW (ref 135–145)
TOTAL PROTEIN: 5.2 g/dL — AB (ref 6.5–8.1)

## 2017-06-30 LAB — PREPARE RBC (CROSSMATCH)

## 2017-06-30 LAB — PROCALCITONIN: Procalcitonin: 2.83 ng/mL

## 2017-06-30 LAB — ABO/RH: ABO/RH(D): O POS

## 2017-06-30 LAB — LACTIC ACID, PLASMA: LACTIC ACID, VENOUS: 4.9 mmol/L — AB (ref 0.5–1.9)

## 2017-06-30 LAB — MRSA PCR SCREENING: MRSA by PCR: NEGATIVE

## 2017-06-30 LAB — SODIUM, URINE, RANDOM: SODIUM UR: 51 mmol/L

## 2017-06-30 LAB — CREATININE, URINE, RANDOM: Creatinine, Urine: 59.73 mg/dL

## 2017-06-30 MED ORDER — SODIUM CHLORIDE 0.9 % IV BOLUS (SEPSIS)
1000.0000 mL | Freq: Once | INTRAVENOUS | Status: AC
Start: 2017-06-30 — End: 2017-06-30
  Administered 2017-06-30: 1000 mL via INTRAVENOUS

## 2017-06-30 MED ORDER — FENTANYL CITRATE (PF) 100 MCG/2ML IJ SOLN
12.5000 ug | INTRAMUSCULAR | Status: DC | PRN
  Administered 2017-06-30 – 2017-07-01 (×2): 12.5 ug via INTRAVENOUS
  Administered 2017-07-01 – 2017-07-02 (×5): 25 ug via INTRAVENOUS
  Filled 2017-06-30 (×9): qty 2

## 2017-06-30 MED ORDER — MORPHINE SULFATE (PF) 4 MG/ML IV SOLN
2.0000 mg | INTRAVENOUS | Status: DC | PRN
  Administered 2017-06-30: 0.5 mg via INTRAVENOUS
  Filled 2017-06-30: qty 1

## 2017-06-30 MED ORDER — SODIUM BICARBONATE 8.4 % IV SOLN
INTRAVENOUS | Status: DC
  Administered 2017-06-30 (×2): via INTRAVENOUS
  Filled 2017-06-30 (×5): qty 150

## 2017-06-30 MED ORDER — PANTOPRAZOLE SODIUM 40 MG IV SOLR
40.0000 mg | Freq: Two times a day (BID) | INTRAVENOUS | Status: DC
  Administered 2017-06-30 – 2017-07-06 (×14): 40 mg via INTRAVENOUS
  Filled 2017-06-30 (×14): qty 40

## 2017-06-30 MED ORDER — SODIUM CHLORIDE 0.9 % IV SOLN
Freq: Once | INTRAVENOUS | Status: AC
  Administered 2017-06-30: 06:00:00 via INTRAVENOUS

## 2017-06-30 MED ORDER — SODIUM CHLORIDE 0.9 % IV SOLN
Freq: Once | INTRAVENOUS | Status: AC
  Administered 2017-06-30: 10:00:00 via INTRAVENOUS

## 2017-06-30 MED ORDER — HYDROCORTISONE NA SUCCINATE PF 100 MG IJ SOLR
100.0000 mg | Freq: Three times a day (TID) | INTRAMUSCULAR | Status: DC
  Administered 2017-06-30 – 2017-07-07 (×21): 100 mg via INTRAVENOUS
  Filled 2017-06-30 (×21): qty 2

## 2017-06-30 NOTE — Progress Notes (Signed)
I stopped by Ms. Kohn's room to meet with her family.  I was able to talk with her daughter, but her husband had left by the time I arrived.  We have set up a meeting for 3PM tomorrow.  Micheline Rough, MD [place] Team [phone]

## 2017-06-30 NOTE — Consult Note (Signed)
Consultation Note Date: 06/30/2017   Patient Name: Hayley Collins  DOB: 02-21-1971  MRN: [id number]  Age / Sex: 46 y.o., female  PCP: Nolene Ebbs, MD Referring Physician: Elmarie Shiley, MD  Reason for Consultation: Establishing goals of care  HPI/Patient Profile: 46 y.o. female  with past medical history of Stage IV ovarian cancer, hypertension, type 2 diabetes, liver and kidney impairment admitted on 07/10/2017 with AKI, concern for sepsis, and anemia. Palliative consulted for goals of care  Clinical Assessment and Goals of Care: I stopped by and met briefly with [name] this AM.  States she remembers me from hospitalization in August.  She is withdrawn and does not want to talk, but she is agreeable to me coming back when her husband is present.  Notes mention that she may be enrolled in hospice, but she declined to enroll at discharge last admission and was given list of agencies to contact if she changed her mind.  It remains unclear to me if she is enrolled with any agency. I did call hospice and palliative care  to check with them and they state that she has not been on service with them for their hospice or palliative program.  SUMMARY OF RECOMMENDATIONS   - Ms. Kamm appears weaker than she did during last admission when I met with her. At that time, she was not really willing to engage in conversations about the fact that she is approaching the end of her life and was clear in her intent to maintain FULL CODE at that point.  She has a strong faith and has relied on this for support and hope that she will continue to improve despite the fact that she is able to admit her body continues to decline.  I strongly recommended to her to enroll in hospice support. While it is mentioned that somebody had stated she was on hospice, I'm not sure that this is actually the case. I have not yet  spoken with her husband, but staff report that husband stated somebody dropped off equipment to them when she was discharged and they have not seen anybody since then. This would be more in line with discharge plan noted in chart for home with home health equiptment. She was provided with a list of hospice agencies and was to call if she changed her mind on hospice enrollment.  When I last met with her, her husband was encouraging her to consider her hospice benefits, however, he was leaving this decision up to her. - She still reports pain today. With her kidney and renal impairment as well as low blood pressure, I have rotated her from morphine to fentanyl. - I will reach out to her husband to work on setting up a family meeting.  Code Status/Advance Care Planning:  Full code   Symptom Management:   Rotate morphine to fentanyl due to renal and hepatic impairment.  Palliative Prophylaxis:   Frequent Pain Assessment  Additional Recommendations (Limitations, Scope, Preferences):  Full Scope Treatment  Psycho-social/Spiritual:  Desire for further Chaplaincy support: Did not address today  Additional Recommendations: Education on Hospice  Prognosis:   Unable to determine due to acuity of illness. Her prognosis is certainly less than 6 months and she should qualify for hospice support if so desired (assuming she is not already enrolled with hospice agency).  Discharge Planning: To Be Determined      Primary Diagnoses: Present on Admission: . AKI (acute kidney injury) ([place]) . Liver masses . Transaminitis . Sepsis ([place]) . Dehydration . Hyperkalemia . Hypotension . Hypoglycemia   I have reviewed the medical record, interviewed the patient and family, and examined the patient. The following aspects are pertinent.  Past Medical History:  Diagnosis Date  . Cancer ([place])   . Diabetes mellitus   . Fatty liver   . GERD (gastroesophageal reflux disease)   . Hypertension     Social History   Social History  . Marital status: Married    Spouse name: N/A  . Number of children: N/A  . Years of education: N/A   Social History Main Topics  . Smoking status: Current Every Day Smoker    Types: E-cigarettes  . Smokeless tobacco: Never Used  . Alcohol use No  . Drug use: No  . Sexual activity: Not Asked   Other Topics Concern  . None   Social History Narrative  . None   Family History  Problem Relation Age of Onset  . Hypertension Other   . Diabetes Other    Scheduled Meds: . budesonide (PULMICORT) nebulizer solution  0.25 mg Nebulization BID  . hydrocortisone sod succinate (SOLU-CORTEF) inj  100 mg Intravenous Q8H  . pantoprazole (PROTONIX) IV  40 mg Intravenous Q12H   Continuous Infusions: . piperacillin-tazobactam (ZOSYN)  IV Stopped (06/30/17 1047)  .  sodium bicarbonate  infusion 1000 mL 125 mL/hr at 06/30/17 1017   PRN Meds:.albuterol, fentaNYL (SUBLIMAZE) injection, ondansetron (ZOFRAN) IV, oxyCODONE, zolpidem Medications Prior to Admission:  Prior to Admission medications   Medication Sig Start Date End Date Taking? Authorizing Provider  albuterol (PROVENTIL HFA;VENTOLIN HFA) 108 (90 Base) MCG/ACT inhaler Inhale 1-2 puffs into the lungs every 6 (six) hours as needed for wheezing. 03/16/17  Yes Tanna Furry, MD  beclomethasone (QVAR) 80 MCG/ACT inhaler Inhale 2 puffs into the lungs 2 (two) times daily as needed (asthma).    Yes [provider]  docusate sodium (COLACE) 100 MG capsule Take 1 capsule (100 mg total) by mouth 2 (two) times daily. 05/15/17  Yes Gareth Morgan, MD  metFORMIN (GLUCOPHAGE) 1000 MG tablet Take 1,000 mg by mouth daily with breakfast.    Yes [provider]  ondansetron (ZOFRAN) 4 MG tablet Take 1 tablet (4 mg total) by mouth every 6 (six) hours. 05/18/17  Yes Charlynne Cousins, MD  oxyCODONE (ROXICODONE) 5 MG immediate release tablet Take 2 tablets (10 mg total) by mouth every 4 (four) hours as  needed for severe pain. 05/18/17  Yes Charlynne Cousins, MD  polyethylene glycol [facility] / Floria Raveling) packet Take 17 g by mouth daily. 05/18/17  Yes Charlynne Cousins, MD  Lidocaine 2 % GEL Apply 1 application topically 2 (two) times daily as needed (bowel movement pain). Patient not taking: Reported on 06/24/2017 05/15/17   Gareth Morgan, MD   No Known Allergies Review of Systems - She will endorse having pain, but does not really describe further. Does not participate otherwise in review of systems  Physical Exam General: Sleepy but arousable, in no acute distress.  HEENT: No bruits, no goiter, no JVD Heart: Regular. No murmur appreciated. Lungs: Good air movement, clear Abdomen: Distended, diffusely tender, palpable mass/hepatosplenomegaly, positive bowel sounds.  Ext: + edema Skin: Warm and dry  Vital Signs: BP (!) 94/43   Pulse 70   Temp (!) 97.5 F (36.4 C) (Oral)   Resp 18   Ht '5\' 6"'  (1.676 m)   Wt 72 kg (158 lb 11.7 oz)   SpO2 100%   BMI 25.62 kg/m  Pain Assessment: 0-10   Pain Score: Asleep   SpO2: SpO2: 100 % O2 Device:SpO2: 100 % O2 Flow Rate: .   IO: Intake/output summary:  Intake/Output Summary (Last 24 hours) at 06/30/17 1101 Last data filed at 06/30/17 1017  Gross per 24 hour  Intake             4460 ml  Output                0 ml  Net             4460 ml    LBM: Last BM Date: 06/30/17 Baseline Weight: Weight: 72 kg (158 lb 11.7 oz) Most recent weight: Weight: 72 kg (158 lb 11.7 oz)     Palliative Assessment/Data:   Flowsheet Rows     Most Recent Value  Intake Tab  Referral Department  Hospitalist  Unit at Time of Referral  ICU  Palliative Care Primary Diagnosis  Cancer  Date Notified  06/30/17  Date of Admission  07/03/2017  Date first seen by Palliative Care  06/30/17  # of days Palliative referral response time  0 Day(s)  # of days IP prior to Palliative referral  1  Clinical Assessment  Palliative Performance Scale Score  30%   Pain Max last 24 hours  8  Pain Min Last 24 hours  0  Psychosocial & Spiritual Assessment  Palliative Care Outcomes     Time in: 0845 Time out: 0940 Time Total: 55 Greater than 50%  of this time was spent counseling and coordinating care related to the above assessment and plan.  Signed by: Micheline Rough, MD   Please contact Palliative Medicine Team phone at [phone] for questions and concerns.  For individual provider: See Shea Evans

## 2017-06-30 NOTE — Progress Notes (Signed)
PROGRESS NOTE    Hayley Collins  WEX:[id number] DOB: [birth year] DOA: 07/12/2017 PCP: Nolene Ebbs, MD    Brief Narrative: Hayley Collins is a 46 y.o. female with medical history significant of hypertension, type 2 diabetes mellitus, and ovarian cancer with metastases, transaminases due to metastasized liver disease, GERD, who presents with generalized weakness and poor oral intake.  Patient states that she has been having poor appetite and decreased oral intake in the past several days, which has been progressively getting worse. She also has loose stool, 4 to 6 time daily (patient is taking docusate and MiraLAX). She has abdominal distention and abdominal pain. No nausea or vomiting. Patient denies symptoms of UTI. No fever or chills. Denies chest pain, cough, shortness of breath. No unilateral weakness.  ED Course: pt was found to have WBC 18.5, lactic acid of 5.07,negative urinalysis, sodium 125-->131, potassium 5.8-->6.0, acute renal injury with creatinine 5.07, abnormal liver function with ALT 1811, AST 358, ALT 113, total bilirubin 39.0. Temperature normal, no tachycardia, has tachypnea, oxygen saturation section 90-99% on room air, hypotension with blood pressure 90/46. Patient is admitted to stepdown as inpatient.    Assessment & Plan:   Principal Problem:   Sepsis ([place]) Active Problems:   Liver masses   Diabetes mellitus type II, non insulin dependent (HCC)   AKI (acute kidney injury) ([place])   Transaminitis   Dehydration   Hyperkalemia   Hypotension   Hypoglycemia   Possible sepsis vs. Dehydration: Hypotension; Patient has hypotension, worsening renal function, elevated lactic acid, likely due to dehydration, but cannot completely rule out sepsis. Pt meets criteria for sepsis with leukocytosis, hypotension, tachypnea. Lactic acid is elevated. Source of infection is not clear. Urinalysis negative. Chest x-ray showed poor inspiration, but no infiltration. -continue with  IV fluids. IV antibiotics. Follow blood culture.  -check GI pathogen.  -stress dose steroids.   Acute kidney injury : pt's Cre is 5.07 and BUN 96 on admission. Likely due to prerenal secondary to dehydration. UA negative for UTI - US-renal negative for obstruction, renal medical diseases.  -Continue with IV fluids.   Liver failure, transaminases; secondary to metastasis Diseases;  -discussed case with Dr Henrene Pastor, he recommend palliative care.   hyponatremia; continue with IV fluids.   Anemia: Hgb is 7.7 and  Close to baseline.-f/u by CBC Plan  to transfuse 2 units PRBC>  Monitor hb.  Husband report blood in the stool.  Check Occult blood.  IV Protonix,   Hyperkalemia: potassium of 5.8-6.0. No EKG change. Due to acute renal injury. -1 g of calcium gluconate was given in ED -Kayexalate 45g 1 -resolved.   Hx of Ovarian cancer with metastases: did not have surgery, chemotherapy or radiation therapy. Patient is not candidate for surgery. Pt has elected for no treatment. She is not following up with any oncologist. In April '18, she was given and estimated 6 months to live without treatment per gyn onc  -palliative care consulted. .  Loose stool: -will hold docusate and MiraLAX  Hx of Hypertension : no has hypotension. Not taking Bp meds now. -monitoring Bp closely   Diabetes mellitus type II, non insulin dependent  Type II DM without complications and not on insulin: Last A1c 10.8 on 12/26/16, poorly controled. Patient is taking metformin at home now patient has hypoglycemia. -hold metformin.  Hypoglycemia: Blood sugar 48. Most likely due to decreased oral intake. Since patient has large liver tumor burden, she have increased IGF secretion. -D5-NS  -cbg q1h.  DVT prophylaxis:  SCD Code Status: Full code.  Family Communication: multiples family member at bedside.  Disposition Plan: remain in the step down unit   Consultants:   Palliative   GI over phone     Procedures:  Renal US; 1. Increased echogenicity within the renal parenchyma bilaterally,  compatible with medical renal disease. No hydronephrosis.   Antimicrobials:  Vancomycin  Zosyn    Subjective: She was sleepy when I saw her this morning.  She is having multiples BM   Objective: Vitals:   06/30/17 0346 06/30/17 0400 06/30/17 0600 06/30/17 0800  BP:  (!) 90/40 (!) 77/38 (!) 101/51  Pulse:  84 81 73  Resp:  (!) 22 20 18   Temp: 97.6 F (36.4 C)     TempSrc: Oral     SpO2:  100% 99% 100%    Intake/Output Summary (Last 24 hours) at 06/30/17 0819 Last data filed at 06/30/17 0121  Gross per 24 hour  Intake             4360 ml  Output                0 ml  Net             4360 ml   There were no vitals filed for this visit.  Examination:  General exam: thin apperating Respiratory system: bilateral ronchus.  Cardiovascular system: S 1, S 2 RRR Gastrointestinal system: abdomen is distended, no rigidity  Central nervous system: Alert and oriented.  Extremities: Symmetric 5 x 5 power. Skin: No rashes, lesions or ulcers    Data Reviewed: I have personally reviewed following labs and imaging studies  CBC:  Recent Labs Lab 07/09/2017 1652 07/08/2017 1739 06/30/17 0357  WBC 18.5*  --  18.7*  NEUTROABS 14.2*  --   --   HGB 7.7* 9.9* 6.7*  HCT 26.0* 29.0* 22.5*  MCV 83.6  --  83.6  PLT 385  --  852   Basic Metabolic Panel:  Recent Labs Lab 06/18/2017 1652 07/02/2017 1739 06/30/17 0357  NA 125* 131* 131*  K 5.8* 6.0* 5.0  CL 95* 105 104  CO2 9*  --  10*  GLUCOSE 74 82 100*  BUN 96* 102* 94*  CREATININE 5.07* 6.60* 4.40*  CALCIUM 9.4  --  8.6*   GFR: CrCl cannot be calculated (Unknown ideal weight.). Liver Function Tests:  Recent Labs Lab 06/30/2017 1652 06/30/17 0357  AST 358* 354*  ALT 113* 106*  ALKPHOS 1,811* 1,597*  BILITOT 39.0* 36.6*  PROT 6.0* 5.2*  ALBUMIN 2.0* 1.7*   No results for input(s): LIPASE, AMYLASE in the last 168  hours. No results for input(s): AMMONIA in the last 168 hours. Coagulation Profile:  Recent Labs Lab 07/06/2017 2156  INR 1.22   Cardiac Enzymes: No results for input(s): CKTOTAL, CKMB, CKMBINDEX, TROPONINI in the last 168 hours. BNP (last 3 results) No results for input(s): PROBNP in the last 8760 hours. HbA1C: No results for input(s): HGBA1C in the last 72 hours. CBG:  Recent Labs Lab 07/02/2017 2303 06/30/17 0305 06/30/17 0740  GLUCAP 48* 104* 93   Lipid Profile: No results for input(s): CHOL, HDL, LDLCALC, TRIG, CHOLHDL, LDLDIRECT in the last 72 hours. Thyroid Function Tests: No results for input(s): TSH, T4TOTAL, FREET4, T3FREE, THYROIDAB in the last 72 hours. Anemia Panel: No results for input(s): VITAMINB12, FOLATE, FERRITIN, TIBC, IRON, RETICCTPCT in the last 72 hours. Sepsis Labs:  Recent Labs Lab 07/02/2017 1740 06/18/2017 2156 06/30/17 0357  PROCALCITON  --  2.83  --   LATICACIDVEN 5.07* 3.8* 4.9*    No results found for this or any previous visit (from the past 240 hour(s)).       Radiology Studies: US Renal  Result Date: 06/17/2017 CLINICAL DATA:  Initial evaluation for acute renal injury. EXAM: RENAL / URINARY TRACT ULTRASOUND COMPLETE COMPARISON:  Prior CT from 05/15/2017. FINDINGS: Right Kidney: Length: 14.3 cm. Increased echogenicity within the renal parenchyma. No mass or hydronephrosis visualized. Left Kidney: Length: 13.6 cm. Increased echogenicity within the renal parenchyma. No mass or hydronephrosis visualized. Bladder: Appears normal for degree of bladder distention. Trace free fluid noted within the abdomen. IMPRESSION: 1. Increased echogenicity within the renal parenchyma bilaterally, compatible with medical renal disease. No hydronephrosis. 2. Trace free fluid within the abdomen. Electronically Signed   By: Jeannine Boga M.D.   On: 07/04/2017 21:31   Dg Chest Port 1 View  Result Date: 07/06/2017 CLINICAL DATA:  Weakness and poor  appetite. Shortness of breath over the last 2 days. EXAM: PORTABLE CHEST 1 VIEW COMPARISON:  04/30/2017 FINDINGS: Poor inspiration. Heart size is normal. Mediastinal shadows normal. The lungs are clear. The vascularity is normal. No effusions. No acute bone finding. IMPRESSION: Poor inspiration.  Allowing for that, no active disease suspected. Electronically Signed   By: Nelson Chimes M.D.   On: 07/09/2017 17:58        Scheduled Meds: . budesonide (PULMICORT) nebulizer solution  0.25 mg Nebulization BID  . hydrocortisone sod succinate (SOLU-CORTEF) inj  100 mg Intravenous Q8H  . pantoprazole (PROTONIX) IV  40 mg Intravenous Q12H   Continuous Infusions: . sodium chloride    . piperacillin-tazobactam (ZOSYN)  IV Stopped (06/30/17 0404)  .  sodium bicarbonate  infusion 1000 mL       LOS: 1 day    Time spent: 35 minutes.     Elmarie Shiley, MD [facility] Pager [phone]  If 7PM-7AM, please contact night-coverage [email or web address] Password [facility] 06/30/2017, 8:19 AM

## 2017-06-30 NOTE — Progress Notes (Signed)
CRITICAL VALUE ALERT  Critical Value:  Lat 4.9, Bil 36.6  Date & Time Notied:  06/30/2017 6:49 AM     Provider Notified: Walden Field NP  Orders Received/Actions taken:

## 2017-06-30 NOTE — Progress Notes (Signed)
CRITICAL VALUE ALERT  Critical Value:  HGB 6.7  Date & Time Notied:  06/30/2017 , 5:24 AM   Provider Notified: Walden Field NP  Orders Received/Actions taken: 0600 yes

## 2017-07-01 ENCOUNTER — Inpatient Hospital Stay (HOSPITAL_COMMUNITY)

## 2017-07-01 LAB — COMPREHENSIVE METABOLIC PANEL
ALBUMIN: 1.8 g/dL — AB (ref 3.5–5.0)
ALT: 112 U/L — ABNORMAL HIGH (ref 14–54)
ANION GAP: 21 — AB (ref 5–15)
AST: 358 U/L — ABNORMAL HIGH (ref 15–41)
Alkaline Phosphatase: 1551 U/L — ABNORMAL HIGH (ref 38–126)
BUN: 86 mg/dL — ABNORMAL HIGH (ref 6–20)
CALCIUM: 8.5 mg/dL — AB (ref 8.9–10.3)
CO2: 10 mmol/L — AB (ref 22–32)
Chloride: 97 mmol/L — ABNORMAL LOW (ref 101–111)
Creatinine, Ser: 4.3 mg/dL — ABNORMAL HIGH (ref 0.44–1.00)
GFR calc non Af Amer: 11 mL/min — ABNORMAL LOW (ref >60)
GFR, EST AFRICAN AMERICAN: 13 mL/min — AB (ref >60)
GLUCOSE: 153 mg/dL — AB (ref 65–99)
Potassium: 4.5 mmol/L (ref 3.5–5.1)
SODIUM: 128 mmol/L — AB (ref 135–145)
Total Bilirubin: 33.7 mg/dL (ref 0.3–1.2)
Total Protein: 5.3 g/dL — ABNORMAL LOW (ref 6.5–8.1)

## 2017-07-01 LAB — GLUCOSE, CAPILLARY
GLUCOSE-CAPILLARY: 150 mg/dL — AB (ref 65–99)
GLUCOSE-CAPILLARY: 153 mg/dL — AB (ref 65–99)
GLUCOSE-CAPILLARY: 138 mg/dL — AB (ref 65–99)
Glucose-Capillary: 153 mg/dL — ABNORMAL HIGH (ref 65–99)

## 2017-07-01 LAB — CBC
HEMATOCRIT: 29.5 % — AB (ref 36.0–46.0)
HEMOGLOBIN: 9.3 g/dL — AB (ref 12.0–15.0)
MCH: 26.2 pg (ref 26.0–34.0)
MCHC: 31.5 g/dL (ref 30.0–36.0)
MCV: 83.1 fL (ref 78.0–100.0)
Platelets: 297 10*3/uL (ref 150–400)
RBC: 3.55 MIL/uL — AB (ref 3.87–5.11)
RDW: 18 % — ABNORMAL HIGH (ref 11.5–15.5)
WBC: 17.2 10*3/uL — ABNORMAL HIGH (ref 4.0–10.5)

## 2017-07-01 LAB — URINE CULTURE: CULTURE: NO GROWTH

## 2017-07-01 LAB — VANCOMYCIN, RANDOM: Vancomycin Rm: 17

## 2017-07-01 MED ORDER — ALBUMIN HUMAN 25 % IV SOLN
25.0000 g | Freq: Four times a day (QID) | INTRAVENOUS | Status: AC
  Administered 2017-07-01 – 2017-07-02 (×4): 25 g via INTRAVENOUS
  Filled 2017-07-01: qty 50
  Filled 2017-07-01 (×3): qty 100
  Filled 2017-07-01: qty 50

## 2017-07-01 MED ORDER — ENSURE ENLIVE PO LIQD
237.0000 mL | Freq: Two times a day (BID) | ORAL | Status: DC
  Administered 2017-07-01 – 2017-07-03 (×2): 237 mL via ORAL

## 2017-07-01 MED ORDER — LORAZEPAM 2 MG/ML IJ SOLN
0.5000 mg | Freq: Four times a day (QID) | INTRAMUSCULAR | Status: DC | PRN
  Administered 2017-07-01: 0.5 mg via INTRAVENOUS
  Filled 2017-07-01 (×2): qty 1

## 2017-07-01 NOTE — Progress Notes (Signed)
Palliative Care Progress Note  Reason for consult: Goals of care in light of metastatic cancer and multiorgan failure  I met with patient's husband, Hayley Collins, in the hall and we discussed the continued decline that Hayley Collins has been experiencing.  He reports that they have had a tough time at home, and he has seen continued decline in her nutrition and functional status.  I shared that I feel she is dying, and Hayley Collins agreed that this is the case.  I told him that I would recommend focusing on comfort and possible residential hospice placement, and he reports understanding this but stated, "she is never going to agree to that."  He reports that she and her family are still waiting for a miracle, and he has been doing his best to support her and her family through her decline.  I then met with patient and her husband.  Shortly after meeting began, we were joined by her sister as well.  Hayley Collins stated that she feels very poorly, and I asked what her body was trying to tell her.  She then replied, "that I'm dying."  I asked her if that scared her and she reported that it did.  She then repeated again that she is dying.  At this point, her sister asked where she placed her faith and asked her to "Claim his name!"  Her sister then anointed her with oil over several parts of her body stating that she was to be healed.  She helped her sister to her to sitting and said she would, "Walk.  It's time to rise and walk."  Hayley Collins, her RN, and I encouraged her to remain in bed.  With coaching, family agreed that staying in bed was the best plan for now as she continues to be weak.  Hayley Collins was too worn out to continue conversation following this.  - I do not feel it is likely that Hayley Collins will change her mind regarding wanting aggressive measures.  She and her family remain hopeful that she will receive healing through God's intervention and she is "not giving up." - I will continue to progress  conversation with her and family as she is emotionally able. - D/w Dr. Tyrell Antonio.  Total time: 40 miniutes Greater than 50%  of this time was spent counseling and coordinating care related to the above assessment and plan.  Micheline Rough, MD [place] [facility] [phone]

## 2017-07-01 NOTE — Progress Notes (Signed)
Initial Nutrition Assessment  DOCUMENTATION CODES:   Severe malnutrition in context of chronic illness  INTERVENTION:   Provide Ensure Enlive po BID, each supplement provides 350 kcal and 20 grams of protein RD will continue to monitor for plan and GOC  NUTRITION DIAGNOSIS:   Malnutrition (severe)related to chronic illness, cancer and cancer related treatments as evidenced by energy intake < or equal to 75% for > or equal to 1 month, mild depletion of body fat, moderate depletions of muscle mass.  GOAL:   Patient will meet greater than or equal to 90% of their needs  MONITOR:   PO intake, Supplement acceptance, Labs, Weight trends, I & O's  REASON FOR ASSESSMENT:   Malnutrition Screening Tool    ASSESSMENT:    46 y.o. female with medical history significant of hypertension, type 2 diabetes mellitus, and ovarian cancer with metastases, transaminases due to metastasized liver disease, GERD, who presents with generalized weakness and poor oral intake.  Patient familiar to RD from previous admission in August. Pt's PO intake has remained poor, 0% PO intakes since admission. Pt was drinking Ensure supplements at home, will order for patient.  Palliative care scheduled meeting at 3pm today. Will monitor for GOC.  Pt has lost 50 lb since 3/6 ( 23% wt loss x 6.5 months, significant for time frame). Nutrition-Focused physical exam completed. Findings are mild fat depletion, moderate muscle depletion, and no edema.   Medications: Iv Protonix every 12 hours Labs reviewed: CBGs: 150-168 Low Na GFR: 13  Diet Order:  Diet heart healthy/carb modified Room service appropriate? Yes; Fluid consistency: Thin  Skin:  Reviewed, no issues  Last BM:  9/16  Height:   Ht Readings from Last 1 Encounters:  06/30/17 5\' 6"  (1.676 m)    Weight:   Wt Readings from Last 1 Encounters:  07/01/17 164 lb 0.4 oz (74.4 kg)    Ideal Body Weight:  59.1 kg  BMI:  Body mass index is 26.47  kg/m.  Estimated Nutritional Needs:   Kcal:  1800-2100  Protein:  65-75g  Fluid:  2L/day  EDUCATION NEEDS:   No education needs identified at this time  Clayton Bibles, MS, RD, LDN Pager: [phone] After Hours Pager: [phone]

## 2017-07-01 NOTE — Progress Notes (Signed)
PROGRESS NOTE    Hayley Collins  PPI:[id number] DOB: 1971/05/18 DOA: 07/13/2017 PCP: Nolene Ebbs, MD    Brief Narrative: Hayley Collins is a 46 y.o. female with medical history significant of hypertension, type 2 diabetes mellitus, and ovarian cancer with metastases, transaminases due to metastasized liver disease, GERD, who presents with generalized weakness and poor oral intake.  Patient states that she has been having poor appetite and decreased oral intake in the past several days, which has been progressively getting worse. She also has loose stool, 4 to 6 time daily (patient is taking docusate and MiraLAX). She has abdominal distention and abdominal pain. No nausea or vomiting. Patient denies symptoms of UTI. No fever or chills. Denies chest pain, cough, shortness of breath. No unilateral weakness.  ED Course: pt was found to have WBC 18.5, lactic acid of 5.07,negative urinalysis, sodium 125-->131, potassium 5.8-->6.0, acute renal injury with creatinine 5.07, abnormal liver function with ALT 1811, AST 358, ALT 113, total bilirubin 39.0. Temperature normal, no tachycardia, has tachypnea, oxygen saturation section 90-99% on room air, hypotension with blood pressure 90/46. Patient is admitted to stepdown as inpatient.    Assessment & Plan:   Principal Problem:   Sepsis ([place]) Active Problems:   Liver masses   Diabetes mellitus type II, non insulin dependent (HCC)   AKI (acute kidney injury) ([place])   Transaminitis   Dehydration   Hyperkalemia   Hypotension   Hypoglycemia   Possible sepsis vs. Dehydration: Hypotension; Patient has hypotension, worsening renal function, elevated lactic acid, likely due to dehydration, but cannot completely rule out sepsis. Pt meets criteria for sepsis with leukocytosis, hypotension, tachypnea. Lactic acid is elevated. Source of infection is not clear. Urinalysis negative. Chest x-ray showed poor inspiration, but no infiltration. -continue with  IV fluids. IV antibiotics. Follow blood culture. Blood culture no growth to date.  -Check GI pathogen. Unable to collect  -Stress dose steroids. continue -MRSA PCR negative--will stop vancomycin,. Continue with Zosyn.   Acute kidney injury : pt's Cre is 5.07 and BUN 96 on admission. Likely due to prerenal secondary to dehydration. UA negative for UTI.  - US-renal negative for obstruction, renal medical diseases.  -Continue with IV fluids.  -IV albumin.   Lactic acidosis, metabolic acidosis.  Might be related to liver failure. Treating for sepsis.  IV bicarb Gtt.   Liver failure, transaminases; secondary to metastasis Diseases;  -discussed case with Dr Henrene Pastor, he recommend palliative care.  -LFT still elevated.  -start albumin. Await palliative care meeting.   hyponatremia; continue with IV fluids.   Anemia:  Hgb is 7.7 and  Close to baseline.-f/u by CBC S/P  2 units PRBC>  Monitor hb.  Husband report blood in the stool.  Check Occult blood.  IV Protonix,  Hb stable this morning.   Hyperkalemia: potassium of 5.8-6.0. No EKG change. Due to acute renal injury. -1 g of calcium gluconate was given in ED -Kayexalate 45g 1 -resolved.   Hx of Ovarian cancer with metastases: did not have surgery, chemotherapy or radiation therapy. Patient is not candidate for surgery. Pt has elected for no treatment. She is not following up with any oncologist. In April '18, she was given and estimated 6 months to live without treatment per gyn onc  -palliative care consulted. . -Will contact Dr Denman George on Monday.  -patient with significant abdominal distension. Will check CT abdomen.   Loose stool: -will hold docusate and MiraLAX  Hx of Hypertension : no has hypotension. Not taking  Bp meds now. -monitoring Bp closely   Diabetes mellitus type II, non insulin dependent  Type II DM without complications and not on insulin: Last A1c 10.8 on 12/26/16, poorly controled. Patient is taking metformin  at home now patient has hypoglycemia. -hold metformin.  Hypoglycemia: Blood sugar 48. Most likely due to decreased oral intake. Since patient has large liver tumor burden, she have increased IGF secretion. -D5-NS  -cbg q1h.  Leukocytosis; on IV antibiotics.   DVT prophylaxis:  SCD Code Status: Full code.  Family Communication: multiples family member at bedside.  Disposition Plan: remain in the step down unit   Consultants:   Palliative   GI over phone    Procedures:  Renal US; 1. Increased echogenicity within the renal parenchyma bilaterally,  compatible with medical renal disease. No hydronephrosis.   Antimicrobials:  Vancomycin discontinue 9-16  Zosyn    Subjective: She is alert, denies pain,. Appears uncomfortable.  No BM since yesterday night.     Objective: Vitals:   07/01/17 0400 07/01/17 0419 07/01/17 0600 07/01/17 0630  BP: (!) 102/54  (!) 104/56   Pulse: 66  74 79  Resp: 16  18 (!) 21  Temp:      TempSrc:      SpO2: 99%  99% 100%  Weight:  74.4 kg (164 lb 0.4 oz)    Height:        Intake/Output Summary (Last 24 hours) at 07/01/17 0730 Last data filed at 07/01/17 0600  Gross per 24 hour  Intake          2609.58 ml  Output              490 ml  Net          2119.58 ml   Filed Weights   06/30/17 1000 07/01/17 0419  Weight: 72 kg (158 lb 11.7 oz) 74.4 kg (164 lb 0.4 oz)    Examination:  General exam: thin apperating Respiratory system: bilateral ronchus.  Cardiovascular system: S 1, S 2 RRR Gastrointestinal system: abdomen is distended, no rigidity mass mid abdomen.  Central nervous system: Alert and oriented.  Extremities: Symmetric 5 x 5 power. Skin: No rashes, lesions or ulcers    Data Reviewed: I have personally reviewed following labs and imaging studies  CBC:  Recent Labs Lab 07/04/2017 1652 06/18/2017 1739 06/30/17 0357 06/30/17 1842 07/01/17 0320  WBC 18.5*  --  18.7* 16.9* 17.2*  NEUTROABS 14.2*  --   --   --   --     HGB 7.7* 9.9* 6.7* 9.3* 9.3*  HCT 26.0* 29.0* 22.5* 30.2* 29.5*  MCV 83.6  --  83.6 85.3 83.1  PLT 385  --  351 319 034   Basic Metabolic Panel:  Recent Labs Lab 06/20/2017 1652 06/28/2017 1739 06/30/17 0357 07/01/17 0320  NA 125* 131* 131* 128*  K 5.8* 6.0* 5.0 4.5  CL 95* 105 104 97*  CO2 9*  --  10* 10*  GLUCOSE 74 82 100* 153*  BUN 96* 102* 94* 86*  CREATININE 5.07* 6.60* 4.40* 4.30*  CALCIUM 9.4  --  8.6* 8.5*   GFR: Estimated Creatinine Clearance: 16.5 mL/min (A) (by C-G formula based on SCr of 4.3 mg/dL (H)). Liver Function Tests:  Recent Labs Lab 07/01/2017 1652 06/30/17 0357 07/01/17 0320  AST 358* 354* 358*  ALT 113* 106* 112*  ALKPHOS 1,811* 1,597* 1,551*  BILITOT 39.0* 36.6* 33.7*  PROT 6.0* 5.2* 5.3*  ALBUMIN 2.0* 1.7* 1.8*   No results  for input(s): LIPASE, AMYLASE in the last 168 hours. No results for input(s): AMMONIA in the last 168 hours. Coagulation Profile:  Recent Labs Lab 07/04/2017 2156  INR 1.22   Cardiac Enzymes: No results for input(s): CKTOTAL, CKMB, CKMBINDEX, TROPONINI in the last 168 hours. BNP (last 3 results) No results for input(s): PROBNP in the last 8760 hours. HbA1C: No results for input(s): HGBA1C in the last 72 hours. CBG:  Recent Labs Lab 06/30/17 0305 06/30/17 0740 06/30/17 1201 06/30/17 1548 06/30/17 2149  GLUCAP 104* 93 106* 154* 168*   Lipid Profile: No results for input(s): CHOL, HDL, LDLCALC, TRIG, CHOLHDL, LDLDIRECT in the last 72 hours. Thyroid Function Tests: No results for input(s): TSH, T4TOTAL, FREET4, T3FREE, THYROIDAB in the last 72 hours. Anemia Panel: No results for input(s): VITAMINB12, FOLATE, FERRITIN, TIBC, IRON, RETICCTPCT in the last 72 hours. Sepsis Labs:  Recent Labs Lab 06/28/2017 1740 07/13/2017 2156 06/30/17 0357  PROCALCITON  --  2.83  --   LATICACIDVEN 5.07* 3.8* 4.9*    Recent Results (from the past 240 hour(s))  Blood culture (routine x 2)     Status: None (Preliminary result)    Collection Time: 06/20/2017  5:35 PM  Result Value Ref Range Status   Specimen Description BLOOD RIGHT FOREARM  Final   Special Requests   Final    BOTTLES DRAWN AEROBIC AND ANAEROBIC Blood Culture adequate volume   Culture   Final    NO GROWTH < 12 HOURS Performed at [facility], [place] [address], [place], [place] [address]    Report Status PENDING  Incomplete  MRSA PCR Screening     Status: None   Collection Time: 06/30/17  9:50 AM  Result Value Ref Range Status   MRSA by PCR NEGATIVE NEGATIVE Final    Comment:        The GeneXpert MRSA Assay (FDA approved for NASAL specimens only), is one component of a comprehensive MRSA colonization surveillance program. It is not intended to diagnose MRSA infection nor to guide or monitor treatment for MRSA infections.          Radiology Studies: US Renal  Result Date: 07/08/2017 CLINICAL DATA:  Initial evaluation for acute renal injury. EXAM: RENAL / URINARY TRACT ULTRASOUND COMPLETE COMPARISON:  Prior CT from 05/15/2017. FINDINGS: Right Kidney: Length: 14.3 cm. Increased echogenicity within the renal parenchyma. No mass or hydronephrosis visualized. Left Kidney: Length: 13.6 cm. Increased echogenicity within the renal parenchyma. No mass or hydronephrosis visualized. Bladder: Appears normal for degree of bladder distention. Trace free fluid noted within the abdomen. IMPRESSION: 1. Increased echogenicity within the renal parenchyma bilaterally, compatible with medical renal disease. No hydronephrosis. 2. Trace free fluid within the abdomen. Electronically Signed   By: Jeannine Boga M.D.   On: 07/03/2017 21:31   Dg Chest Port 1 View  Result Date: 07/06/2017 CLINICAL DATA:  Weakness and poor appetite. Shortness of breath over the last 2 days. EXAM: PORTABLE CHEST 1 VIEW COMPARISON:  04/30/2017 FINDINGS: Poor inspiration. Heart size is normal. Mediastinal shadows normal. The lungs are clear. The vascularity is normal. No  effusions. No acute bone finding. IMPRESSION: Poor inspiration.  Allowing for that, no active disease suspected. Electronically Signed   By: Nelson Chimes M.D.   On: 07/12/2017 17:58        Scheduled Meds: . budesonide (PULMICORT) nebulizer solution  0.25 mg Nebulization BID  . hydrocortisone sod succinate (SOLU-CORTEF) inj  100 mg Intravenous Q8H  . pantoprazole (PROTONIX) IV  40 mg Intravenous Q12H   Continuous Infusions: . albumin human    . piperacillin-tazobactam (ZOSYN)  IV Stopped (07/01/17 0230)  .  sodium bicarbonate  infusion 1000 mL 100 mL/hr at 06/30/17 2101     LOS: 2 days    Time spent: 35 minutes.     Elmarie Shiley, MD [facility] Pager [phone]  If 7PM-7AM, please contact night-coverage [email or web address] Password [email or web address] 07/01/2017, 7:30 AM

## 2017-07-02 LAB — CBC
HCT: 25.9 % — ABNORMAL LOW (ref 36.0–46.0)
Hemoglobin: 8 g/dL — ABNORMAL LOW (ref 12.0–15.0)
MCH: 25.6 pg — AB (ref 26.0–34.0)
MCHC: 30.9 g/dL (ref 30.0–36.0)
MCV: 82.7 fL (ref 78.0–100.0)
PLATELETS: 298 10*3/uL (ref 150–400)
RBC: 3.13 MIL/uL — AB (ref 3.87–5.11)
RDW: 18.2 % — AB (ref 11.5–15.5)
WBC: 18.5 10*3/uL — ABNORMAL HIGH (ref 4.0–10.5)

## 2017-07-02 LAB — GASTROINTESTINAL PANEL BY PCR, STOOL (REPLACES STOOL CULTURE)
ASTROVIRUS: NOT DETECTED
Adenovirus F40/41: NOT DETECTED
CAMPYLOBACTER SPECIES: NOT DETECTED
CYCLOSPORA CAYETANENSIS: NOT DETECTED
Cryptosporidium: NOT DETECTED
ENTEROTOXIGENIC E COLI (ETEC): NOT DETECTED
Entamoeba histolytica: NOT DETECTED
Enteroaggregative E coli (EAEC): NOT DETECTED
Enteropathogenic E coli (EPEC): NOT DETECTED
Giardia lamblia: NOT DETECTED
Norovirus GI/GII: NOT DETECTED
PLESIMONAS SHIGELLOIDES: NOT DETECTED
ROTAVIRUS A: NOT DETECTED
SAPOVIRUS (I, II, IV, AND V): NOT DETECTED
SHIGA LIKE TOXIN PRODUCING E COLI (STEC): NOT DETECTED
Salmonella species: NOT DETECTED
Shigella/Enteroinvasive E coli (EIEC): NOT DETECTED
Vibrio cholerae: NOT DETECTED
Vibrio species: NOT DETECTED
Yersinia enterocolitica: NOT DETECTED

## 2017-07-02 LAB — TYPE AND SCREEN
ABO/RH(D): O POS
ANTIBODY SCREEN: NEGATIVE
Unit division: 0
Unit division: 0

## 2017-07-02 LAB — BPAM RBC
BLOOD PRODUCT EXPIRATION DATE: 201810142359
Blood Product Expiration Date: 201810142359
ISSUE DATE / TIME: 201809151512
ISSUE DATE / TIME: 201809151029
UNIT TYPE AND RH: 5100
Unit Type and Rh: 5100

## 2017-07-02 LAB — PROTIME-INR
INR: 1.31
PROTHROMBIN TIME: 16.2 s — AB (ref 11.4–15.2)

## 2017-07-02 LAB — GLUCOSE, CAPILLARY: Glucose-Capillary: 112 mg/dL — ABNORMAL HIGH (ref 65–99)

## 2017-07-02 LAB — MAGNESIUM: MAGNESIUM: 1.7 mg/dL (ref 1.7–2.4)

## 2017-07-02 MED ORDER — ALBUMIN HUMAN 25 % IV SOLN
25.0000 g | Freq: Four times a day (QID) | INTRAVENOUS | Status: DC
  Administered 2017-07-02 (×2): 25 g via INTRAVENOUS
  Filled 2017-07-02 (×2): qty 100

## 2017-07-02 MED ORDER — HALOPERIDOL 0.5 MG PO TABS
0.5000 mg | ORAL_TABLET | ORAL | Status: DC | PRN
  Filled 2017-07-02: qty 1

## 2017-07-02 MED ORDER — ORAL CARE MOUTH RINSE: 15.0000 mL | Freq: Two times a day (BID) | OROMUCOSAL | Status: DC

## 2017-07-02 MED ORDER — ACETAMINOPHEN 650 MG RE SUPP: 650.0000 mg | Freq: Four times a day (QID) | RECTAL | Status: DC | PRN

## 2017-07-02 MED ORDER — BIOTENE DRY MOUTH MT LIQD: 15.0000 mL | OROMUCOSAL | Status: DC | PRN

## 2017-07-02 MED ORDER — GLYCOPYRROLATE 0.2 MG/ML IJ SOLN
0.2000 mg | INTRAMUSCULAR | Status: DC | PRN
  Filled 2017-07-02: qty 1

## 2017-07-02 MED ORDER — ONDANSETRON 4 MG PO TBDP: 4.0000 mg | ORAL_TABLET | Freq: Four times a day (QID) | ORAL | Status: DC | PRN

## 2017-07-02 MED ORDER — ACETAMINOPHEN 325 MG PO TABS: 650.0000 mg | ORAL_TABLET | Freq: Four times a day (QID) | ORAL | Status: DC | PRN

## 2017-07-02 MED ORDER — LORAZEPAM 2 MG/ML IJ SOLN
1.0000 mg | INTRAMUSCULAR | Status: DC | PRN
  Administered 2017-07-02 – 2017-07-06 (×3): 1 mg via INTRAVENOUS
  Filled 2017-07-02 (×3): qty 1

## 2017-07-02 MED ORDER — DEXTROSE 5 % IV SOLN
2.0000 g | INTRAVENOUS | Status: DC
  Administered 2017-07-02: 2 g via INTRAVENOUS
  Filled 2017-07-02: qty 2

## 2017-07-02 MED ORDER — POLYVINYL ALCOHOL 1.4 % OP SOLN
1.0000 [drp] | Freq: Four times a day (QID) | OPHTHALMIC | Status: DC | PRN
  Filled 2017-07-02: qty 15

## 2017-07-02 MED ORDER — FENTANYL CITRATE (PF) 100 MCG/2ML IJ SOLN
25.0000 ug | INTRAMUSCULAR | Status: DC | PRN
  Administered 2017-07-02 – 2017-07-05 (×10): 50 ug via INTRAVENOUS
  Filled 2017-07-02 (×4): qty 2

## 2017-07-02 MED ORDER — SODIUM CHLORIDE 0.9 % IV SOLN
30.0000 ug/h | INTRAVENOUS | Status: DC
  Administered 2017-07-02: 20 ug/h via INTRAVENOUS
  Filled 2017-07-02: qty 50

## 2017-07-02 MED ORDER — LORAZEPAM 1 MG PO TABS: 1.0000 mg | ORAL_TABLET | ORAL | Status: DC | PRN

## 2017-07-02 MED ORDER — HALOPERIDOL LACTATE 2 MG/ML PO CONC
0.5000 mg | ORAL | Status: DC | PRN
  Filled 2017-07-02: qty 0.3

## 2017-07-02 MED ORDER — GLYCOPYRROLATE 1 MG PO TABS
1.0000 mg | ORAL_TABLET | ORAL | Status: DC | PRN
  Filled 2017-07-02: qty 1

## 2017-07-02 MED ORDER — FENTANYL BOLUS VIA INFUSION
30.0000 ug | INTRAVENOUS | Status: DC | PRN
  Administered 2017-07-03 – 2017-07-05 (×4): 30 ug via INTRAVENOUS
  Filled 2017-07-02: qty 30

## 2017-07-02 MED ORDER — ONDANSETRON HCL 4 MG/2ML IJ SOLN: 4.0000 mg | Freq: Four times a day (QID) | INTRAMUSCULAR | Status: DC | PRN

## 2017-07-02 MED ORDER — LORAZEPAM 2 MG/ML PO CONC: 1.0000 mg | ORAL | Status: DC | PRN

## 2017-07-02 MED ORDER — HALOPERIDOL LACTATE 5 MG/ML IJ SOLN: 1.0000 mg | INTRAMUSCULAR | Status: DC | PRN

## 2017-07-02 NOTE — Progress Notes (Signed)
   07/02/17 1400  Clinical Encounter Type  Visited With Patient and family together  Visit Type Follow-up  Spiritual Encounters  Spiritual Needs Emotional;Prayer  Stress Factors  Patient Stress Factors Health changes  Family Stress Factors Major life changes  Follow up on patient.  Palliative is supporting the family.  Patient will move to another room maybe today.  Patient was sleeping and being kept comfortable, supported by family and long time friends.  Patient is [employer] in faith community.  Will pass on for them to be visited by [facility] again.

## 2017-07-02 NOTE — Progress Notes (Addendum)
Palliative care progress note  Reason for Consult: Goals of care in light of metastatic disease and multisystem organ failure  I met this AM with Hayley Collins and talked with her husband, Hayley Collins, via phone.  Hayley Collins reports being "tired" and wanting to be comfortable.  We discussed that in light of her metastatic disease with multisystem organ failure, heroic interventions at the end-of-life will not result in her being able to spend quality time with her family.  She and her husband agree this would not be in line with prior expressed wishes for a natural death or be likely to lead to getting well enough to go back home. They were in agreement with changing CODE STATUS to DO NOT RESUSCITATE.  Her daughter Hayley Collins is struggling with the fact her mother is dying, and she is coming to the hospital and I will speak with her when she arrives.  Hayley Collins wants to honor Hayley Collins's wishes, but also wants Hayley Collins to be part of conversation as she is Hayley Collins's only child.  I discussed with Hayley Collins, her husband, and cousin at bedside plan for comfort care.  I told her husband I was changing her code status, but will wait until Hayley Collins arrives before making full transition to comfort care.  He was in agreement with plan.  Micheline Rough, MD [facility] Palliative Medicine Team [phone]  Addendum:  I met again with patient once her daughter was present. 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  I talked with her daughter about the fact that her mother is dying regardless of interventions moving forward.  Hayley Collins again stated that she is suffering and wants to be comfortable.  I discussed plan for comfort care with her daughter and placed orders with focus on comfort.  - Plan moving forward is for full comfort care. Discussed with Dr. Tyrell Antonio.  Total time: 55 minutes Greater than 50%  of this time was spent  counseling and coordinating care related to the above assessment and plan.  Micheline Rough, MD [place] Team [phone]

## 2017-07-02 NOTE — Care Management Note (Signed)
Case Management Note  Patient Details  Name: [name] MRN: [id number] Date of Birth: [birth year]  Subjective/Objective:                  metastatic cancer and multiorgan failure  Action/Plan: Date:  July 02, 2017 Chart reviewed for concurrent status and case management needs. Will continue to follow patient progress. Discharge Planning: following for needs Expected discharge date: [id number] Velva Harman, BSN, [place], [place]  Expected Discharge Date:   (unknown)               Expected Discharge Plan:     In-House Referral:     Discharge planning Services     Post Acute Care Choice:    Choice offered to:     DME Arranged:    DME Agency:     HH Arranged:    [place] Agency:     Status of Service:     If discussed at [employer] of Stay Meetings, dates discussed:    Additional Comments:  Leeroy Cha, RN 07/02/2017, 8:57 AM

## 2017-07-02 NOTE — Progress Notes (Signed)
[facility] RN Visit @ 11:45 AM  This is related and covered GIP admission from 06/30/17 to [facility] diagnosis of ovarian cancer, per Dr. Tomasa Hosteller.  Patient was a FULL Code prior to admission.  Patient was admitted to hospice services on 06/21/17.  Patient was brought into ED for c/o weakness and poor appetite over 2 days.  Hospice was not notified.  Patient was admitted for treatment of sepsis.  Patient seen in room with 2 granddaughters at bedside.  Patient lethargic and difficult to arouse.  Her sclera are yellow and her mouth is very dry.  Patient said very quietly "just let me die."  Patient transitioned to comfort measures this morning and was made a DNR.  Spoke to Diplomatic Services operational officer, who plans to initiate a continuous Fentanyl infusion when prepared by pharmacy.  Patient last received 50 mcg of Fentanyl IV for pain and 4 doses of 25 mcg Fentanyl IV the past 24 hours for pain. Appreciate PMT, Dr. Domingo Cocking for assisting in pain management and goals of care.  Education has been provided to family in regards to [facility] counseling available for 2 granddaughters that live in the home.  [facility] will continue to follow daily and anticipate any needs.  Please contact with any hospice-related questions or concerns.  Please contact [facility] at [phone] at time of death.  Thank you,  Freddi Starr RN, BSN [facility] Liaison [phone]  All hospital liaisons now available on [place].

## 2017-07-02 NOTE — Progress Notes (Signed)
PROGRESS NOTE    Hayley Collins  GHW:[id number] DOB: [birth year] DOA: 06/16/2017 PCP: Nolene Ebbs, MD    Brief Narrative: Hayley Collins is a 46 y.o. female with medical history significant of hypertension, type 2 diabetes mellitus, and ovarian cancer with metastases, transaminases due to metastasized liver disease, GERD, who presents with generalized weakness and poor oral intake.  Patient states that she has been having poor appetite and decreased oral intake in the past several days, which has been progressively getting worse. She also has loose stool, 4 to 6 time daily (patient is taking docusate and MiraLAX). She has abdominal distention and abdominal pain. No nausea or vomiting. Patient denies symptoms of UTI. No fever or chills. Denies chest pain, cough, shortness of breath. No unilateral weakness.  ED Course: pt was found to have WBC 18.5, lactic acid of 5.07,negative urinalysis, sodium 125-->131, potassium 5.8-->6.0, acute renal injury with creatinine 5.07, abnormal liver function with ALT 1811, AST 358, ALT 113, total bilirubin 39.0. Temperature normal, no tachycardia, has tachypnea, oxygen saturation section 90-99% on room air, hypotension with blood pressure 90/46. Patient is admitted to stepdown as inpatient.    Assessment & Plan:   Principal Problem:   Sepsis ([place]) Active Problems:   Liver masses   Diabetes mellitus type II, non insulin dependent (HCC)   AKI (acute kidney injury) ([place])   Transaminitis   Dehydration   Hyperkalemia   Hypotension   Hypoglycemia   Dehydration: Hypotension; Patient has hypotension, worsening renal function, elevated lactic acid, likely due to dehydration, but cannot completely rule out sepsis. Pt meets criteria for sepsis with leukocytosis, hypotension, tachypnea. Lactic acid is elevated. Source of infection is not clear. Urinalysis negative. Chest x-ray showed poor inspiration, but no infiltration. -continue with IV fluids. IV  antibiotics. Follow blood culture. Blood culture no growth to date.  -sepsis was rule ou, Blood culture, urine culture negative.  -Stress dose steroids. continue -MRSA PCR negative--will stop vancomycin,. Received IV zosyn.  Hypotension, multifactorial, poor oral intake, from diarrhea, FTT from malignancy, Hypoalbuminemia, liver failure.    Hx of Ovarian cancer with metastases: did not have surgery, chemotherapy or radiation therapy. Patient is not candidate for surgery. Pt has elected for no treatment. She is not following up with any oncologist. In April '18, she was given and estimated 6 months to live without treatment per gyn onc  -palliative care consulted. . -CT abdomen with worsening hepatomegalia and metastasis in the liver.  -Patient with very poor prognosis, she was admitted with renal failure, Metabolic acidosis, worsening transaminases, she decline chemotherapy for her cancer. She has not received any treatment for malignancy. Her prognosis is very poor, she has not responded to medical treatment for renal failure and infection. Patient was more receptive to comfort care today. Palliative acre spoke with patient and family and patient will be transition to comfort care.   Acute kidney injury : pt's Cre is 5.07 and BUN 96 on admission. Likely due to prerenal secondary to dehydration. UA negative for UTI.  - US-renal negative for obstruction, renal medical diseases.  -Continue with IV fluids.  -IV albumin.  -no improvement with maximal therapy. Patient with poor prognosis.   Lactic acidosis, metabolic acidosis.  Might be related to liver failure. Treated for  Sepsis, but culture has been negative.  Patient was treated with bicarb Gtt. Metabolic acidosis didn't improved.   Liver failure, transaminases; secondary to metastasis Diseases;  -discussed case with Dr Henrene Pastor, he recommend palliative care.  -LFT still  elevated.  -treated with IV albumin.  -CT abdomen showed worsening  hepatomegalia and metastasis in the liver. Small ascites. Will defer paracentesis, patient wants comfort care now.    hyponatremia; continue with IV fluids.   Anemia:  Hgb is 7.7 and  Close to baseline.-f/u by CBC S/P  2 units PRBC>  Monitor hb.  Husband report blood in the stool.  IV Protonix,   Hyperkalemia: potassium of 5.8-6.0. No EKG change. Due to acute renal injury. -1 g of calcium gluconate was given in ED -Kayexalate 45g 1 -resolved.   Loose stool: -will hold docusate and MiraLAX  Hx of Hypertension : no has hypotension. Not taking Bp meds now. -monitoring Bp closely   Diabetes mellitus type II, non insulin dependent  Type II DM without complications and not on insulin: Last A1c 10.8 on 12/26/16, poorly controled. Patient is taking metformin at home now patient has hypoglycemia. -hold metformin.  Hypoglycemia: Blood sugar 48. Most likely due to decreased oral intake. Since patient has large liver tumor burden, she have increased IGF secretion. -D5-NS  -cbg q1h.  Leukocytosis; on IV antibiotics.   DVT prophylaxis:  SCD Code Status: Full code.  Family Communication: sister, cousin.  Disposition Plan: remain in the step down unit   Consultants:   Palliative   GI over phone    Procedures:  Renal US; 1. Increased echogenicity within the renal parenchyma bilaterally,  compatible with medical renal disease. No hydronephrosis.   Antimicrobials:  Vancomycin discontinue 9-16  Zosyn    Subjective: She is very uncomfortable. She is having pain all over. She is tired, she wants the lord to take her. She says I am delirium. I initially wanted to be full code, now I don't want that. I am ready. She was able to tell me that she is in the hospital, she recognized her cousin who was at bedside.     Objective: Vitals:   07/02/17 0343 07/02/17 0400 07/02/17 0500 07/02/17 0600  BP:  (!) 89/51  (!) 96/51  Pulse:  73  70  Resp:  17  20  Temp: (!) 97.3 F  (36.3 C)     TempSrc: Axillary     SpO2:  99%  90%  Weight:   79.1 kg (174 lb 6.1 oz)   Height:        Intake/Output Summary (Last 24 hours) at 07/02/17 0800 Last data filed at 07/02/17 6063  Gross per 24 hour  Intake             2050 ml  Output              180 ml  Net             1870 ml   Filed Weights   06/30/17 1000 07/01/17 0419 07/02/17 0500  Weight: 72 kg (158 lb 11.7 oz) 74.4 kg (164 lb 0.4 oz) 79.1 kg (174 lb 6.1 oz)    Examination:  General exam: cachetic, icteric.  Respiratory system: Bilateral ronchus.  Cardiovascular system: S 1, S 2 RRR Gastrointestinal system: abdomen is distended, hepatomegaly, mass ,   Central nervous system: alert and oriented.  Extremities: moves extremities.      Data Reviewed: I have personally reviewed following labs and imaging studies  CBC:  Recent Labs Lab 06/17/2017 1652 07/11/2017 1739 06/30/17 0357 06/30/17 1842 07/01/17 0320 07/02/17 0349  WBC 18.5*  --  18.7* 16.9* 17.2* 18.5*  NEUTROABS 14.2*  --   --   --   --   --  HGB 7.7* 9.9* 6.7* 9.3* 9.3* 8.0*  HCT 26.0* 29.0* 22.5* 30.2* 29.5* 25.9*  MCV 83.6  --  83.6 85.3 83.1 82.7  PLT 385  --  351 319 297 678   Basic Metabolic Panel:  Recent Labs Lab 07/13/2017 1652 07/01/2017 1739 06/30/17 0357 07/01/17 0320 07/02/17 0349  NA 125* 131* 131* 128* 129*  K 5.8* 6.0* 5.0 4.5 4.3  CL 95* 105 104 97* 92*  CO2 9*  --  10* 10* 12*  GLUCOSE 74 82 100* 153* 125*  BUN 96* 102* 94* 86* 89*  CREATININE 5.07* 6.60* 4.40* 4.30* 4.68*  CALCIUM 9.4  --  8.6* 8.5* 8.4*  MG  --   --   --   --  1.7   GFR: Estimated Creatinine Clearance: 15.6 mL/min (A) (by C-G formula based on SCr of 4.68 mg/dL (H)). Liver Function Tests:  Recent Labs Lab 06/28/2017 1652 06/30/17 0357 07/01/17 0320 07/02/17 0349  AST 358* 354* 358* <5*  ALT 113* 106* 112* 115*  ALKPHOS 1,811* 1,597* 1,551* 1,361*  BILITOT 39.0* 36.6* 33.7* 17.7*  PROT 6.0* 5.2* 5.3* 5.7*  ALBUMIN 2.0* 1.7* 1.8* 2.5*     No results for input(s): LIPASE, AMYLASE in the last 168 hours. No results for input(s): AMMONIA in the last 168 hours. Coagulation Profile:  Recent Labs Lab 06/20/2017 2156 07/02/17 0349  INR 1.22 1.31   Cardiac Enzymes: No results for input(s): CKTOTAL, CKMB, CKMBINDEX, TROPONINI in the last 168 hours. BNP (last 3 results) No results for input(s): PROBNP in the last 8760 hours. HbA1C: No results for input(s): HGBA1C in the last 72 hours. CBG:  Recent Labs Lab 07/01/17 0743 07/01/17 1453 07/01/17 1744 07/01/17 2141 07/02/17 0734  GLUCAP 150* 153* 153* 138* 112*   Lipid Profile: No results for input(s): CHOL, HDL, LDLCALC, TRIG, CHOLHDL, LDLDIRECT in the last 72 hours. Thyroid Function Tests: No results for input(s): TSH, T4TOTAL, FREET4, T3FREE, THYROIDAB in the last 72 hours. Anemia Panel: No results for input(s): VITAMINB12, FOLATE, FERRITIN, TIBC, IRON, RETICCTPCT in the last 72 hours. Sepsis Labs:  Recent Labs Lab 06/28/2017 1740 07/03/2017 2156 06/30/17 0357  PROCALCITON  --  2.83  --   LATICACIDVEN 5.07* 3.8* 4.9*    Recent Results (from the past 240 hour(s))  Blood culture (routine x 2)     Status: None (Preliminary result)   Collection Time: 07/10/2017  5:35 PM  Result Value Ref Range Status   Specimen Description BLOOD RIGHT FOREARM  Final   Special Requests   Final    BOTTLES DRAWN AEROBIC AND ANAEROBIC Blood Culture adequate volume   Culture   Final    NO GROWTH 2 DAYS Performed at [facility], [place] [address] [place], [place] [address]    Report Status PENDING  Incomplete  Blood culture (routine x 2)     Status: None (Preliminary result)   Collection Time: 07/12/2017  6:10 PM  Result Value Ref Range Status   Specimen Description BLOOD RIGHT HAND  Final   Special Requests IN PEDIATRIC BOTTLE Blood Culture adequate volume  Final   Culture   Final    NO GROWTH 2 DAYS Performed at [facility], [place] [address], [place], [place] [address]     Report Status PENDING  Incomplete  Urine Culture     Status: None   Collection Time: 06/17/2017  9:10 PM  Result Value Ref Range Status   Specimen Description URINE, CLEAN CATCH  Final  Special Requests NONE  Final   Culture   Final    NO GROWTH Performed at [facility], [place] [address], [place], [place] [address]    Report Status 07/01/2017 FINAL  Final  MRSA PCR Screening     Status: None   Collection Time: 06/30/17  9:50 AM  Result Value Ref Range Status   MRSA by PCR NEGATIVE NEGATIVE Final    Comment:        The GeneXpert MRSA Assay (FDA approved for NASAL specimens only), is one component of a comprehensive MRSA colonization surveillance program. It is not intended to diagnose MRSA infection nor to guide or monitor treatment for MRSA infections.          Radiology Studies: Ct Abdomen Pelvis Wo Contrast  Result Date: 07/01/2017 CLINICAL DATA:  46 year old female with abdominal pain and distention. End-stage metastatic ovarian cancer. EXAM: CT ABDOMEN AND PELVIS WITHOUT CONTRAST TECHNIQUE: Multidetector CT imaging of the abdomen and pelvis was performed following the standard protocol without IV contrast. COMPARISON:  05/15/2017 abdominal CT. FINDINGS: Please note that parenchymal abnormalities may be missed without intravenous contrast. Lower chest: Trace bilateral pleural effusions are now noted with mild right basilar atelectasis. Small right middle lobe and lower lobe nodules have slightly enlarged. Hepatobiliary: Markedly enlargement of the liver is again noted with innumerable hepatic masses, which appear increased in size from the prior study no significant gallbladder abnormality. Pancreas: No definite abnormality Spleen: Unremarkable Adrenals/Urinary Tract: The kidneys and adrenal glands are unremarkable. A Foley catheter is present within the bladder. Stomach/Bowel: No bowel obstruction or definite inflammatory changes. Vascular/Lymphatic: No abdominal aortic  aneurysm. Mildly enlarged pelvic and retroperitoneal lymph nodes are unchanged. Reproductive: Uterine/right adnexal masses are unchanged. Other: A small to moderate amount of ascites within the abdomen and pelvis now noted. Diffuse subcutaneous edema is present. There is no evidence of pneumoperitoneum. Musculoskeletal: No acute abnormality or suspicious bony lesion. IMPRESSION: 1. Increasing marked hepatomegaly with apparent enlargement of widespread hepatic metastases. 2. New small to moderate amount of ascites, trace bilateral pleural effusions and diffuse subcutaneous edema 3. Slightly enlarging right middle lobe and right lower lobe pulmonary nodules/metastases. Mild right basilar atelectasis. 4. Uterine/right adnexal masses are unchanged. Electronically Signed   By: Margarette Canada M.D.   On: 07/01/2017 14:09        Scheduled Meds: . budesonide (PULMICORT) nebulizer solution  0.25 mg Nebulization BID  . feeding supplement (ENSURE ENLIVE)  237 mL Oral BID BM  . hydrocortisone sod succinate (SOLU-CORTEF) inj  100 mg Intravenous Q8H  . mouth rinse  15 mL Mouth Rinse BID  . pantoprazole (PROTONIX) IV  40 mg Intravenous Q12H   Continuous Infusions: . albumin human    . cefTRIAXone (ROCEPHIN)  IV    .  sodium bicarbonate  infusion 1000 mL 100 mL/hr at 06/30/17 2101     LOS: 3 days    Time spent: 35 minutes.     Elmarie Shiley, MD [facility] Pager [phone]  If 7PM-7AM, please contact night-coverage [email or web address] Password [email or web address] 07/02/2017, 8:00 AM

## 2017-07-03 DIAGNOSIS — Z7189 Other specified counseling: Secondary | ICD-10-CM

## 2017-07-03 DIAGNOSIS — R160 Hepatomegaly, not elsewhere classified: Secondary | ICD-10-CM

## 2017-07-03 DIAGNOSIS — Z515 Encounter for palliative care: Secondary | ICD-10-CM

## 2017-07-03 LAB — COMPREHENSIVE METABOLIC PANEL
ALT: 115 U/L — AB (ref 14–54)
ANION GAP: 25 — AB (ref 5–15)
AST: <5 U/L — ABNORMAL LOW (ref 15–41)
Albumin: 2.5 g/dL — ABNORMAL LOW (ref 3.5–5.0)
Alkaline Phosphatase: 1361 U/L — ABNORMAL HIGH (ref 38–126)
BUN: 89 mg/dL — ABNORMAL HIGH (ref 6–20)
CO2: 12 mmol/L — AB (ref 22–32)
CREATININE: 4.68 mg/dL — AB (ref 0.44–1.00)
Calcium: 8.4 mg/dL — ABNORMAL LOW (ref 8.9–10.3)
Chloride: 92 mmol/L — ABNORMAL LOW (ref 101–111)
GFR, EST AFRICAN AMERICAN: 12 mL/min — AB (ref >60)
GFR, EST NON AFRICAN AMERICAN: 10 mL/min — AB (ref >60)
Glucose, Bld: 125 mg/dL — ABNORMAL HIGH (ref 65–99)
POTASSIUM: 4.3 mmol/L (ref 3.5–5.1)
SODIUM: 129 mmol/L — AB (ref 135–145)
Total Bilirubin: 17.7 mg/dL — ABNORMAL HIGH (ref 0.3–1.2)
Total Protein: 5.7 g/dL — ABNORMAL LOW (ref 6.5–8.1)

## 2017-07-03 NOTE — Progress Notes (Addendum)
[facility] [place] RN Visit @ 1030am.   This is related and covered GIP admission from 06/30/17 to [facility] diagnosis of ovarian cancer, per Dr. Tomasa Hosteller.  Patient was a FULL Code prior to admission.  Patient was admitted to hospice services on 06/21/17.  Patient was brought into ED for c/o weakness and poor appetite over 2 days.  Hospice was not notified.  Patient was admitted for treatment of sepsis.  Visited with patient and daughter at bedside.  Patient was sleeping and did not wake up when I tried to arouse her.  Patient has very dark urine yield in her foley.  Patient is in NAD at this time.  Shallow breaths with mouth open.    Patient receiving:  Continuous Medications:  fentaNYL (SUBLIMAZE) 2,500 mcg in sodium chloride 0.9% 250 mL (10 mcg/mL) infusion, Rate:  2 mL/hr, Dose 20 mcg/hr, continuous via IV.  PRN medications given today thus far:  fentaNYL (SUBLIMAZE) bolus via infusion 30 mcg, Dose 30 mcg, Q15 min PRN via IV at 0745 and 0821; fentaNYL (SUBLIMAZE) injection 25-50 mcg, Dose 50 mcg, Q1H PRN via IV at 0019; LORazepam (ATIVAN) injection 1 mg, Dose 1 mg, Q4H PRN via IV at 0752.  [facility] will continue to follow daily and anticipate any needs.  Please contact with any hospice-related questions or concerns.  Please contact [facility] at [phone] at time of death.  Thank you,  Edyth Gunnels, RN, BSN [facility] Liaison [phone]  All hospital liaisons now available on [place].

## 2017-07-03 NOTE — Progress Notes (Signed)
PROGRESS NOTE    Hayley Collins  VFI:[id number] DOB: 1971/01/27 DOA: 07/04/2017 PCP: Nolene Ebbs, MD    Brief Narrative: Hayley Collins is a 45 y.o. female with medical history significant of hypertension, type 2 diabetes mellitus, and ovarian cancer with metastases, transaminases due to metastasized liver disease, GERD, who presents with generalized weakness and poor oral intake.  Patient states that she has been having poor appetite and decreased oral intake in the past several days, which has been progressively getting worse. She also has loose stool, 4 to 6 time daily (patient is taking docusate and MiraLAX). She has abdominal distention and abdominal pain. No nausea or vomiting. Patient denies symptoms of UTI. No fever or chills. Denies chest pain, cough, shortness of breath. No unilateral weakness.  ED Course: pt was found to have WBC 18.5, lactic acid of 5.07,negative urinalysis, sodium 125-->131, potassium 5.8-->6.0, acute renal injury with creatinine 5.07, abnormal liver function with ALT 1811, AST 358, ALT 113, total bilirubin 39.0. Temperature normal, no tachycardia, has tachypnea, oxygen saturation section 90-99% on room air, hypotension with blood pressure 90/46. Patient is admitted to stepdown as inpatient.  Patient was admitted with progressive metastasis ovarian cancer to liver with multi organ failure. Renal failure, metabolic acidosis, liver failure. CT abdomen showed progression of liver metastasis. Patient was treated aggressively with IV fluids, IV albumin, IV antibiotics. Patient despite medical intervention her prognosis was very poor. After multiples family meeting and meeting with the patient, it was decide to proceed with full comfort care.   Assessment & Plan:   Principal Problem:   Sepsis ([place]) Active Problems:   Liver masses   Diabetes mellitus type II, non insulin dependent ([place])   AKI (acute kidney injury) ([place])   Transaminitis   Dehydration  Hyperkalemia   Hypotension   Hypoglycemia   Encounter for palliative care   Goals of care, counseling/discussion   Dehydration: Hypotension; Patient has hypotension, worsening renal function, elevated lactic acid, likely due to dehydration, but cannot completely rule out sepsis. Pt meets criteria for sepsis with leukocytosis, hypotension, tachypnea. Lactic acid is elevated. Source of infection is not clear. Urinalysis negative. Chest x-ray showed poor inspiration, but no infiltration. -continue with IV fluids. IV antibiotics. Follow blood culture. Blood culture no growth to date.  -sepsis was rule ou, Blood culture, urine culture negative.  -Stress dose steroids. continue -MRSA PCR negative--will stop vancomycin,. Received IV zosyn.  Hypotension, multifactorial, poor oral intake, from diarrhea, FTT from malignancy, Hypoalbuminemia, liver failure.    Hx of Ovarian cancer with metastases: did not have surgery, chemotherapy or radiation therapy. Patient is not candidate for surgery. Pt has elected for no treatment. She is not following up with any oncologist. In April '18, she was given and estimated 6 months to live without treatment per gyn onc  -palliative care consulted. . -CT abdomen with worsening hepatomegalia and metastasis in the liver.  -Patient with very poor prognosis, she was admitted with renal failure, Metabolic acidosis, worsening transaminases, she decline chemotherapy for her cancer in the past. She has not received any treatment for malignancy. Her prognosis is very poor, she has not responded to medical treatment for renal failure and infection. Patient was more receptive to comfort care on 9-18. Palliative acre spoke with patient and family and patient will be transition to comfort care.   Acute kidney injury : pt's Cre is 5.07 and BUN 96 on admission. Likely due to prerenal secondary to dehydration. UA negative for UTI.  - US-renal  negative for obstruction, renal medical  diseases.  -Continue with IV fluids.  -IV albumin.  -no improvement with maximal therapy. Patient with poor prognosis.   Lactic acidosis, metabolic acidosis.  Might be related to liver failure. Treated for  Sepsis, but culture has been negative.  Patient was treated with bicarb Gtt. Metabolic acidosis didn't improved.   Liver failure, transaminases; secondary to metastasis Diseases;  -Discussed case with Dr Henrene Pastor, he recommend palliative care.  -LFT still elevated.  -treated with IV albumin.  -CT abdomen showed worsening hepatomegalia and metastasis in the liver. Small ascites. Will defer paracentesis, patient wants comfort care now.    hyponatremia; treated with IV fluids.   Anemia:  Hgb is 7.7 and  Close to baseline.-f/u by CBC S/P  2 units PRBC>  Monitor hb.  Husband report blood in the stool.  IV Protonix,  Comfort care.   Hyperkalemia: potassium of 5.8-6.0. No EKG change. Due to acute renal injury. -1 g of calcium gluconate was given in ED -Kayexalate 45g 1 -resolved.   Loose stool: -will hold docusate and MiraLAX  Hx of Hypertension : no has hypotension. Not taking Bp meds now. -monitoring Bp closely   Diabetes mellitus type II, non insulin dependent  Type II DM without complications and not on insulin: Last A1c 10.8 on 12/26/16, poorly controled. Patient is taking metformin at home now patient has hypoglycemia. -hold metformin.  Hypoglycemia: Blood sugar 48. Most likely due to decreased oral intake. Since patient has large liver tumor burden, she have increased IGF secretion. -D5-NS  -cbg q1h.  Leukocytosis; on IV antibiotics.   DVT prophylaxis:  SCD Code Status: Full code.  Family Communication: multiple family at bedside.  Disposition Plan: remain in the hospital, expect hospital death.   Consultants:   Palliative   GI over phone    Procedures:  Renal US; 1. Increased echogenicity within the renal parenchyma bilaterally,  compatible with  medical renal disease. No hydronephrosis.   Antimicrobials:  Vancomycin discontinue 9-16  Zosyn    Subjective: Lethargic. Per husband patient was uncomfortable last night. She received a dose of pain medications last night on top of the drip. This morning she appears comfortable.     Objective: Vitals:   07/02/17 1813 07/02/17 1940 07/03/17 0511 07/03/17 0921  BP: (!) 92/59  (!) 145/99   Pulse: 79     Resp: 17  12   Temp: 97.6 F (36.4 C)  (!) 97.4 F (36.3 C)   TempSrc: Axillary  Axillary   SpO2: 100% 95% 92% 93%  Weight:      Height:        Intake/Output Summary (Last 24 hours) at 07/03/17 1346 Last data filed at 07/03/17 0900  Gross per 24 hour  Intake               90 ml  Output                0 ml  Net               90 ml   Filed Weights   06/30/17 1000 07/01/17 0419 07/02/17 0500  Weight: 72 kg (158 lb 11.7 oz) 74.4 kg (164 lb 0.4 oz) 79.1 kg (174 lb 6.1 oz)    Examination:  General exam: Cachetic , icteric  Respiratory system: bilateral ronchus   Cardiovascular system: S 1, S 2 RRR Gastrointestinal system: abdomen is distended, hepatomegaly, mass ,   Central nervous system: lethargic  Extremities: edema  Data Reviewed: I have personally reviewed following labs and imaging studies  CBC:  Recent Labs Lab 06/18/2017 1652 07/06/2017 1739 06/30/17 0357 06/30/17 1842 07/01/17 0320 07/02/17 0349  WBC 18.5*  --  18.7* 16.9* 17.2* 18.5*  NEUTROABS 14.2*  --   --   --   --   --   HGB 7.7* 9.9* 6.7* 9.3* 9.3* 8.0*  HCT 26.0* 29.0* 22.5* 30.2* 29.5* 25.9*  MCV 83.6  --  83.6 85.3 83.1 82.7  PLT 385  --  351 319 297 973   Basic Metabolic Panel:  Recent Labs Lab 07/02/2017 1652 07/04/2017 1739 06/30/17 0357 07/01/17 0320 07/02/17 0349  NA 125* 131* 131* 128* 129*  K 5.8* 6.0* 5.0 4.5 4.3  CL 95* 105 104 97* 92*  CO2 9*  --  10* 10* 12*  GLUCOSE 74 82 100* 153* 125*  BUN 96* 102* 94* 86* 89*  CREATININE 5.07* 6.60* 4.40* 4.30* 4.68*  CALCIUM  9.4  --  8.6* 8.5* 8.4*  MG  --   --   --   --  1.7   GFR: Estimated Creatinine Clearance: 15.6 mL/min (A) (by C-G formula based on SCr of 4.68 mg/dL (H)). Liver Function Tests:  Recent Labs Lab 06/26/2017 1652 06/30/17 0357 07/01/17 0320 07/02/17 0349  AST 358* 354* 358* <5*  ALT 113* 106* 112* 115*  ALKPHOS 1,811* 1,597* 1,551* 1,361*  BILITOT 39.0* 36.6* 33.7* 17.7*  PROT 6.0* 5.2* 5.3* 5.7*  ALBUMIN 2.0* 1.7* 1.8* 2.5*   No results for input(s): LIPASE, AMYLASE in the last 168 hours. No results for input(s): AMMONIA in the last 168 hours. Coagulation Profile:  Recent Labs Lab 07/06/2017 2156 07/02/17 0349  INR 1.22 1.31   Cardiac Enzymes: No results for input(s): CKTOTAL, CKMB, CKMBINDEX, TROPONINI in the last 168 hours. BNP (last 3 results) No results for input(s): PROBNP in the last 8760 hours. HbA1C: No results for input(s): HGBA1C in the last 72 hours. CBG:  Recent Labs Lab 07/01/17 0743 07/01/17 1453 07/01/17 1744 07/01/17 2141 07/02/17 0734  GLUCAP 150* 153* 153* 138* 112*   Lipid Profile: No results for input(s): CHOL, HDL, LDLCALC, TRIG, CHOLHDL, LDLDIRECT in the last 72 hours. Thyroid Function Tests: No results for input(s): TSH, T4TOTAL, FREET4, T3FREE, THYROIDAB in the last 72 hours. Anemia Panel: No results for input(s): VITAMINB12, FOLATE, FERRITIN, TIBC, IRON, RETICCTPCT in the last 72 hours. Sepsis Labs:  Recent Labs Lab 07/08/2017 1740 06/24/2017 2156 06/30/17 0357  PROCALCITON  --  2.83  --   LATICACIDVEN 5.07* 3.8* 4.9*    Recent Results (from the past 240 hour(s))  Blood culture (routine x 2)     Status: None (Preliminary result)   Collection Time: 06/26/2017  5:35 PM  Result Value Ref Range Status   Specimen Description BLOOD RIGHT FOREARM  Final   Special Requests   Final    BOTTLES DRAWN AEROBIC AND ANAEROBIC Blood Culture adequate volume   Culture   Final    NO GROWTH 3 DAYS Performed at [facility], [place] [address] [place], [place] [address]    Report Status PENDING  Incomplete  Blood culture (routine x 2)     Status: None (Preliminary result)   Collection Time: 06/23/2017  6:10 PM  Result Value Ref Range Status   Specimen Description BLOOD RIGHT HAND  Final   Special Requests IN PEDIATRIC BOTTLE Blood Culture adequate volume  Final   Culture   Final    NO GROWTH  3 DAYS Performed at [facility], [place] [address] [place], [place] [address]    Report Status PENDING  Incomplete  Urine Culture     Status: None   Collection Time: 06/28/2017  9:10 PM  Result Value Ref Range Status   Specimen Description URINE, CLEAN CATCH  Final   Special Requests NONE  Final   Culture   Final    NO GROWTH Performed at [facility], [place] [address] [place], [place] [address]    Report Status 07/01/2017 FINAL  Final  MRSA PCR Screening     Status: None   Collection Time: 06/30/17  9:50 AM  Result Value Ref Range Status   MRSA by PCR NEGATIVE NEGATIVE Final    Comment:        The GeneXpert MRSA Assay (FDA approved for NASAL specimens only), is one component of a comprehensive MRSA colonization surveillance program. It is not intended to diagnose MRSA infection nor to guide or monitor treatment for MRSA infections.   Gastrointestinal Panel by PCR , Stool     Status: None   Collection Time: 06/30/17  2:51 PM  Result Value Ref Range Status   Campylobacter species NOT DETECTED NOT DETECTED Final   Plesimonas shigelloides NOT DETECTED NOT DETECTED Final   Salmonella species NOT DETECTED NOT DETECTED Final   Yersinia enterocolitica NOT DETECTED NOT DETECTED Final   Vibrio species NOT DETECTED NOT DETECTED Final   Vibrio cholerae NOT DETECTED NOT DETECTED Final   Enteroaggregative E coli (EAEC) NOT DETECTED NOT DETECTED Final   Enteropathogenic E coli (EPEC) NOT DETECTED NOT DETECTED Final   Enterotoxigenic E coli (ETEC) NOT DETECTED NOT DETECTED Final   Shiga like toxin producing E coli (STEC) NOT  DETECTED NOT DETECTED Final   Shigella/Enteroinvasive E coli (EIEC) NOT DETECTED NOT DETECTED Final   Cryptosporidium NOT DETECTED NOT DETECTED Final   Cyclospora cayetanensis NOT DETECTED NOT DETECTED Final   Entamoeba histolytica NOT DETECTED NOT DETECTED Final   Giardia lamblia NOT DETECTED NOT DETECTED Final   Adenovirus F40/41 NOT DETECTED NOT DETECTED Final   Astrovirus NOT DETECTED NOT DETECTED Final   Norovirus GI/GII NOT DETECTED NOT DETECTED Final   Rotavirus A NOT DETECTED NOT DETECTED Final   Sapovirus (I, II, IV, and V) NOT DETECTED NOT DETECTED Final         Radiology Studies: No results found.      Scheduled Meds: . budesonide (PULMICORT) nebulizer solution  0.25 mg Nebulization BID  . feeding supplement (ENSURE ENLIVE)  237 mL Oral BID BM  . hydrocortisone sod succinate (SOLU-CORTEF) inj  100 mg Intravenous Q8H  . pantoprazole (PROTONIX) IV  40 mg Intravenous Q12H   Continuous Infusions: . fentaNYL infusion INTRAVENOUS 20 mcg/hr (07/02/17 1600)     LOS: 4 days    Time spent: 35 minutes.     Elmarie Shiley, MD [facility] Pager [phone]  If 7PM-7AM, please contact night-coverage [email or web address] Password [email or web address] 07/03/2017, 1:46 PM

## 2017-07-03 NOTE — Progress Notes (Signed)
Daily Progress Note   Patient Name: Hayley Collins       Date: 07/03/2017 DOB: 01/24/1971  Age: 46 y.o. MRN#: [id number] Attending Physician: Elmarie Shiley, MD Primary Care Physician: Nolene Ebbs, MD Admit Date: 07/08/2017  Reason for Consultation/Follow-up: Terminal Care  Life limiting disease: metastatic ovarian cancer with multisystem organ failure.  Subjective: Patient opens her eyes, when her name is called, she is not very responsive at all, she appears icteric, she has ongoing mottling and increasing edema in both lower extremities.   Family at bedside, holding vigil, sisters, brothers, friends and several other family members present in the room. A phone playing gospel music is placed near the patient's ear, for now, she appears comfortable for now, on the fentanyl drip, does moan in pain when her feet are touched.   See below:   Length of Stay: 4  Current Medications: Scheduled Meds:  . budesonide (PULMICORT) nebulizer solution  0.25 mg Nebulization BID  . feeding supplement (ENSURE ENLIVE)  237 mL Oral BID BM  . hydrocortisone sod succinate (SOLU-CORTEF) inj  100 mg Intravenous Q8H  . pantoprazole (PROTONIX) IV  40 mg Intravenous Q12H    Continuous Infusions: . fentaNYL infusion INTRAVENOUS 20 mcg/hr (07/02/17 1600)    PRN Meds: acetaminophen **OR** acetaminophen, albuterol, antiseptic oral rinse, fentaNYL, fentaNYL (SUBLIMAZE) injection, glycopyrrolate **OR** glycopyrrolate **OR** glycopyrrolate, haloperidol **OR** haloperidol **OR** haloperidol lactate, LORazepam **OR** LORazepam **OR** LORazepam, LORazepam, ondansetron **OR** ondansetron (ZOFRAN) IV, polyvinyl alcohol  Physical Exam         Opens eyes, in mild distress Shallow breathing S1 S2 Worsening  generalized edema Some mottling evident in bilateral LE Abdomen distended  Vital Signs: BP (!) 145/99 (BP Location: Left Arm)   Pulse 79   Temp (!) 97.4 F (36.3 C) (Axillary)   Resp 12   Ht 5\' 6"  (1.676 m)   Wt 79.1 kg (174 lb 6.1 oz)   SpO2 93%   BMI 28.15 kg/m  SpO2: SpO2: 93 % O2 Device: O2 Device: Not Delivered O2 Flow Rate:    Intake/output summary:  Intake/Output Summary (Last 24 hours) at 07/03/17 1256 Last data filed at 07/03/17 0900  Gross per 24 hour  Intake               90 ml  Output  0 ml  Net               90 ml   LBM: Last BM Date: 07/03/17 Baseline Weight: Weight: 72 kg (158 lb 11.7 oz) Most recent weight: Weight: 79.1 kg (174 lb 6.1 oz)       Palliative Assessment/Data:    Flowsheet Rows     Most Recent Value  Intake Tab  Referral Department  Hospitalist  Unit at Time of Referral  ICU  Palliative Care Primary Diagnosis  Cancer  Date Notified  06/30/17  Date of Admission  07/13/2017  Date first seen by Palliative Care  06/30/17  # of days Palliative referral response time  0 Day(s)  # of days IP prior to Palliative referral  1  Clinical Assessment  Palliative Performance Scale Score  30%  Pain Max last 24 hours  8  Pain Min Last 24 hours  0  Psychosocial & Spiritual Assessment  Palliative Care Outcomes      Patient Active Problem List   Diagnosis Date Noted  . Hypoglycemia 06/30/2017  . Sepsis ([place]) 07/03/2017  . Dehydration 06/27/2017  . Hyperkalemia 07/06/2017  . Hypotension 07/06/2017  . Transaminitis 05/17/2017  . AKI (acute kidney injury) ([place]) 05/15/2017  . Constipation 05/15/2017  . Microcytic anemia 05/15/2017  . Thrombocytosis ([place]) 05/15/2017  . Hypertension 05/15/2017  . 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 Gynecologic malignancy ([place]) 01/16/2017  . Ovarian mass, right 12/26/2016  . Enlarged uterus 12/26/2016  . Liver masses 12/26/2016  . Diabetes mellitus type II, non insulin  dependent ([place]) 12/26/2016    Palliative Care Assessment & Plan   Patient Profile:    Assessment:  metastatic ovarian cancer with multisystem organ failure Generalized weakness End of life care  Recommendations/Plan:  continue comfort measures Continue to support the family Appreciate chaplain and [facility] input and inter disciplinary approach to care.   Goals of Care and Additional Recommendations:  Limitations on Scope of Treatment: Full Comfort Care  Code Status:    Code Status Orders        Start     Ordered   07/02/17 1120  Do not attempt resuscitation (DNR)  Continuous    Question Answer Comment  In the event of cardiac or respiratory ARREST Do not call a "code blue"   In the event of cardiac or respiratory ARREST Do not perform Intubation, CPR, defibrillation or ACLS   In the event of cardiac or respiratory ARREST Use medication by any route, position, wound care, and other measures to relive pain and suffering. May use oxygen, suction and manual treatment of airway obstruction as needed for comfort.      07/02/17 1122    Code Status History    Date Active Date Inactive Code Status Order ID Comments User Context   07/02/2017  9:30 AM 07/02/2017 11:22 AM DNR [id number]  Micheline Rough, MD Inpatient   06/25/2017  8:42 PM 07/02/2017  9:30 AM Full Code [id number]  Ivor Costa, MD ED   05/15/2017 11:43 PM 05/18/2017  1:34 PM Full Code [id number]  Vianne Bulls, MD ED       Prognosis:   Hours - Days Possibly final 24 hours.   Discharge Planning:  Anticipated Hospital Death  Care plan was discussed with  Patient's family.   Thank you for allowing the Palliative Medicine Team to assist in the care of this patient.   Time In: 10 Time Out: 10.35 Total Time 35 Prolonged Time Billed  no       Greater than 50%  of this time was spent counseling and coordinating care related to the above assessment and plan.  Loistine Chance, MD [phone]  Please contact Palliative  Medicine Team phone at [phone] for questions and concerns.

## 2017-07-04 LAB — CULTURE, BLOOD (ROUTINE X 2)
CULTURE: NO GROWTH
Culture: NO GROWTH
Special Requests: ADEQUATE
Special Requests: ADEQUATE

## 2017-07-04 NOTE — Progress Notes (Signed)
Progress Note    Hayley Collins  OIZ:[id number] DOB: November 25, 1970  DOA: 07/02/2017 PCP: Nolene Ebbs, MD    Brief Narrative:   Chief complaint: Metastatic ovarian cancer with weakness and poor appetite  Medical records reviewed and are as summarized below:  Hayley Collins is an 46 y.o. female the PMH of hypertension, type 2 diabetes, and metastatic ovarian cancer who was admitted on 06/20/2017 with generalized weakness and poor oral intake. She had multi-organ failure on admission, and given her overall poor prognosis, ultimately decided to pursue full comfort measures.  Assessment/Plan:   Principal Problem:   Suspected Sepsis ([place]) versus dehydration with hypotension/AKI Sepsis ruled out with negative blood and urine cultures, negative chest x-ray. Initially treated with vancomycin/Zosyn for suspicion of sepsis, but hypotension, lactic acidosis and acute renal failure now felt to be from dehydration.  Active Problems:   Ovarian cancer with Liver masses with transaminitis Evaluated by the palliative care team and has now transitioned to full comfort measures. She is currently being managed on a fentanyl drip for pain control. She is under GIP status with an anticipated in-hospital death.    Diabetes mellitus type II, non insulin dependent (HCC)/hypoglycemia No further CBG checks or treatment indicated at this time.    Hyperkalemia Resolved with Kayexalate.   Family Communication/Anticipated D/C date and plan/Code Status   DVT prophylaxis: None in light of comfort care. Code Status: DO NOT RESUSCITATE  Family Communication: Multiple family updated at the bedside. Disposition Plan: Anticipated in-hospital death.   Medical Consultants:    Palliative Care   Anti-Infectives:    Rocephin 07/02/17---> 07/02/17  Vancomycin 07/13/2017---> 06/30/17  Zosyn 07/12/2017---> 07/02/17  Subjective:   The patient is complaining of some abdominal pain today. Very poor appetite.  Family surrounds her at the bedside. Minimally verbal.  Objective:    Vitals:   07/03/17 0511 07/03/17 0921 07/03/17 1950 07/04/17 0700  BP: (!) 145/99   99/62  Pulse:    78  Resp: 12   12  Temp: (!) 97.4 F (36.3 C)   (!) 97.3 F (36.3 C)  TempSrc: Axillary   Oral  SpO2: 92% 93% 92% 96%  Weight:      Height:        Intake/Output Summary (Last 24 hours) at 07/04/17 0853 Last data filed at 07/04/17 0701  Gross per 24 hour  Intake               60 ml  Output              150 ml  Net              -90 ml   Filed Weights   06/30/17 1000 07/01/17 0419 07/02/17 0500  Weight: 72 kg (158 lb 11.7 oz) 74.4 kg (164 lb 0.4 oz) 79.1 kg (174 lb 6.1 oz)    Exam: General: Uncomfortable appearing with a furrowed brow. Temporal wasting/cachectic. Cardiovascular: Heart sounds show a regular rate, and rhythm. No gallops or rubs. No murmurs. No JVD. Lungs: Diminished. No rales, rhonchi or wheezes. Abdomen: Firm/distended. Hepatomegaly with abdominal mass palpable. Neurological: Awake, minimally verbal.  Data Reviewed:   I have personally reviewed following labs and imaging studies:  Labs: Labs show the following: No new labs since 07/02/17.  Basic Metabolic Panel:  Recent Labs Lab 07/14/2017 1652 06/26/2017 1739 06/30/17 0357 07/01/17 0320 07/02/17 0349  NA 125* 131* 131* 128* 129*  K 5.8* 6.0* 5.0 4.5 4.3  CL 95*  105 104 97* 92*  CO2 9*  --  10* 10* 12*  GLUCOSE 74 82 100* 153* 125*  BUN 96* 102* 94* 86* 89*  CREATININE 5.07* 6.60* 4.40* 4.30* 4.68*  CALCIUM 9.4  --  8.6* 8.5* 8.4*  MG  --   --   --   --  1.7   GFR Estimated Creatinine Clearance: 15.6 mL/min (A) (by C-G formula based on SCr of 4.68 mg/dL (H)). Liver Function Tests:  Recent Labs Lab 07/01/2017 1652 06/30/17 0357 07/01/17 0320 07/02/17 0349  AST 358* 354* 358* <5*  ALT 113* 106* 112* 115*  ALKPHOS 1,811* 1,597* 1,551* 1,361*  BILITOT 39.0* 36.6* 33.7* 17.7*  PROT 6.0* 5.2* 5.3* 5.7*  ALBUMIN 2.0* 1.7*  1.8* 2.5*   No results for input(s): LIPASE, AMYLASE in the last 168 hours. No results for input(s): AMMONIA in the last 168 hours. Coagulation profile  Recent Labs Lab 06/21/2017 2156 07/02/17 0349  INR 1.22 1.31    CBC:  Recent Labs Lab 06/21/2017 1652 06/30/2017 1739 06/30/17 0357 06/30/17 1842 07/01/17 0320 07/02/17 0349  WBC 18.5*  --  18.7* 16.9* 17.2* 18.5*  NEUTROABS 14.2*  --   --   --   --   --   HGB 7.7* 9.9* 6.7* 9.3* 9.3* 8.0*  HCT 26.0* 29.0* 22.5* 30.2* 29.5* 25.9*  MCV 83.6  --  83.6 85.3 83.1 82.7  PLT 385  --  351 319 297 298   Cardiac Enzymes: No results for input(s): CKTOTAL, CKMB, CKMBINDEX, TROPONINI in the last 168 hours. BNP (last 3 results) No results for input(s): PROBNP in the last 8760 hours. CBG:  Recent Labs Lab 07/01/17 0743 07/01/17 1453 07/01/17 1744 07/01/17 2141 07/02/17 0734  GLUCAP 150* 153* 153* 138* 112*   D-Dimer: No results for input(s): DDIMER in the last 72 hours. Hgb A1c: No results for input(s): HGBA1C in the last 72 hours. Lipid Profile: No results for input(s): CHOL, HDL, LDLCALC, TRIG, CHOLHDL, LDLDIRECT in the last 72 hours. Thyroid function studies: No results for input(s): TSH, T4TOTAL, T3FREE, THYROIDAB in the last 72 hours.  Invalid input(s): FREET3 Anemia work up: No results for input(s): VITAMINB12, FOLATE, FERRITIN, TIBC, IRON, RETICCTPCT in the last 72 hours. Sepsis Labs:  Recent Labs Lab 07/15/2017 1740 07/14/2017 2156 06/30/17 0357 06/30/17 1842 07/01/17 0320 07/02/17 0349  PROCALCITON  --  2.83  --   --   --   --   WBC  --   --  18.7* 16.9* 17.2* 18.5*  LATICACIDVEN 5.07* 3.8* 4.9*  --   --   --     Microbiology Recent Results (from the past 240 hour(s))  Blood culture (routine x 2)     Status: None (Preliminary result)   Collection Time: 07/12/2017  5:35 PM  Result Value Ref Range Status   Specimen Description BLOOD RIGHT FOREARM  Final   Special Requests   Final    BOTTLES DRAWN AEROBIC  AND ANAEROBIC Blood Culture adequate volume   Culture   Final    NO GROWTH 4 DAYS Performed at [facility], [place] [address] [place], [place] [address]    Report Status PENDING  Incomplete  Blood culture (routine x 2)     Status: None (Preliminary result)   Collection Time: 07/15/2017  6:10 PM  Result Value Ref Range Status   Specimen Description BLOOD RIGHT HAND  Final   Special Requests IN PEDIATRIC BOTTLE Blood Culture adequate volume  Final   Culture  Final    NO GROWTH 4 DAYS Performed at [facility], [place] [address] [place], [place] [address]    Report Status PENDING  Incomplete  Urine Culture     Status: None   Collection Time: 07/12/2017  9:10 PM  Result Value Ref Range Status   Specimen Description URINE, CLEAN CATCH  Final   Special Requests NONE  Final   Culture   Final    NO GROWTH Performed at [facility], [place] [address], [place], [place] [address]    Report Status 07/01/2017 FINAL  Final  MRSA PCR Screening     Status: None   Collection Time: 06/30/17  9:50 AM  Result Value Ref Range Status   MRSA by PCR NEGATIVE NEGATIVE Final    Comment:        The GeneXpert MRSA Assay (FDA approved for NASAL specimens only), is one component of a comprehensive MRSA colonization surveillance program. It is not intended to diagnose MRSA infection nor to guide or monitor treatment for MRSA infections.   Gastrointestinal Panel by PCR , Stool     Status: None   Collection Time: 06/30/17  2:51 PM  Result Value Ref Range Status   Campylobacter species NOT DETECTED NOT DETECTED Final   Plesimonas shigelloides NOT DETECTED NOT DETECTED Final   Salmonella species NOT DETECTED NOT DETECTED Final   Yersinia enterocolitica NOT DETECTED NOT DETECTED Final   Vibrio species NOT DETECTED NOT DETECTED Final   Vibrio cholerae NOT DETECTED NOT DETECTED Final   Enteroaggregative E coli (EAEC) NOT DETECTED NOT DETECTED Final   Enteropathogenic E coli (EPEC) NOT DETECTED  NOT DETECTED Final   Enterotoxigenic E coli (ETEC) NOT DETECTED NOT DETECTED Final   Shiga like toxin producing E coli (STEC) NOT DETECTED NOT DETECTED Final   Shigella/Enteroinvasive E coli (EIEC) NOT DETECTED NOT DETECTED Final   Cryptosporidium NOT DETECTED NOT DETECTED Final   Cyclospora cayetanensis NOT DETECTED NOT DETECTED Final   Entamoeba histolytica NOT DETECTED NOT DETECTED Final   Giardia lamblia NOT DETECTED NOT DETECTED Final   Adenovirus F40/41 NOT DETECTED NOT DETECTED Final   Astrovirus NOT DETECTED NOT DETECTED Final   Norovirus GI/GII NOT DETECTED NOT DETECTED Final   Rotavirus A NOT DETECTED NOT DETECTED Final   Sapovirus (I, II, IV, and V) NOT DETECTED NOT DETECTED Final    Procedures and diagnostic studies:  No results found.  Medications:   . budesonide (PULMICORT) nebulizer solution  0.25 mg Nebulization BID  . feeding supplement (ENSURE ENLIVE)  237 mL Oral BID BM  . hydrocortisone sod succinate (SOLU-CORTEF) inj  100 mg Intravenous Q8H  . pantoprazole (PROTONIX) IV  40 mg Intravenous Q12H   Continuous Infusions: . fentaNYL infusion INTRAVENOUS 20 mcg/hr (07/02/17 1600)     LOS: 5 days   RAMA,CHRISTINA  [facility] Pager [phone]. If unable to reach me by pager, please call my cell phone at [phone].  *Please refer to [email or web address], password [email or web address] to get updated schedule on who will round on this patient, as hospitalists switch teams weekly. If 7PM-7AM, please contact night-coverage at [email or web address], password [email or web address] for any overnight needs.  07/04/2017, 8:53 AM

## 2017-07-04 NOTE — Plan of Care (Signed)
Problem: Education: Goal: Knowledge of  General Education information/materials will improve Outcome: Not Applicable Date Met: 33/58/25 Comfort care

## 2017-07-04 NOTE — Progress Notes (Addendum)
[facility]-GIP RN Visit @ [id number].   This is related and covered GIP admission from 06/30/17 to [facility] diagnosis of ovarian cancer, per Dr. Tomasa Hosteller. Patient was a FULL Code prior to admission. Patient was admitted to hospice services on 06/21/17. Patient was brought into ED for c/o weakness and poor appetite over 2 days. Hospice was not notified. Patient was admitted for treatment of sepsis.  Visited patient at bedside.  Patient was asleep and did not wake while I was in the room.  Patient in NAD.  Patient with shallow breaths.  Family member on the couch in the room asleep and did not wake while I was in the room.    Patient receiving:  Continuous Medications:  fentaNYL (SUBLIMAZE) 2,500 mcg in sodium chloride 0.9% 250 mL (10 mcg/mL) infusion, Rate:  2 mL/hr, Dose 20 mcg/hr, continuous via IV.  PRN medications given today thus far: fentaNYL (SUBLIMAZE) injection 25-50 mcg, Dose 50 mcg, Q1H PRN via IV at 0036.  We will continue to monitor while patient is hospitalized.  Please call with any hospice related questions or concerns.   Thank you.  Edyth Gunnels, RN, BSN [facility] Liaison [phone]  All hospital liaisons are now on [place].

## 2017-07-05 DIAGNOSIS — R52 Pain, unspecified: Secondary | ICD-10-CM

## 2017-07-05 MED ORDER — SODIUM CHLORIDE 0.9 % IV SOLN
1.0000 mg/h | INTRAVENOUS | Status: DC
  Administered 2017-07-05: 2 mg/h via INTRAVENOUS
  Filled 2017-07-05: qty 2.5

## 2017-07-05 MED ORDER — SODIUM CHLORIDE 0.9 % IV SOLN
1.0000 mg/h | INTRAVENOUS | Status: DC
  Administered 2017-07-06: 1 mg/h via INTRAVENOUS
  Filled 2017-07-05 (×3): qty 2.5

## 2017-07-05 MED ORDER — HYDROMORPHONE BOLUS VIA INFUSION
0.5000 mg | INTRAVENOUS | Status: DC | PRN
  Administered 2017-07-06 (×3): 0.5 mg via INTRAVENOUS
  Filled 2017-07-05: qty 1

## 2017-07-05 MED ORDER — HYDROMORPHONE BOLUS VIA INFUSION
1.0000 mg | INTRAVENOUS | Status: DC | PRN
  Filled 2017-07-05: qty 1

## 2017-07-05 NOTE — Progress Notes (Signed)
Daily Progress Note   Patient Name: Hayley Collins       Date: 07/05/2017 DOB: 05/14/1971  Age: 46 y.o. MRN#: [id number] Attending Physician: Tonye Royalty, MD Primary Care Physician: Nolene Ebbs, MD Admit Date: 07/15/2017  Reason for Consultation/Follow-up: Terminal Care  Life limiting disease: metastatic ovarian cancer with multisystem organ failure.  Subjective: Patient opens her eyes, when her name is called, she is not very responsive at all, she appears icteric, she has ongoing mottling and increasing edema in both lower extremities, increasing edema in abdomen as well.   Family at bedside, holding vigil, sisters, brothers, friends and several other family members present in the room. Husband also at the bedside currently, he states that the patient's pain is not well controlled, he does not think that the Fentanyl is working, he would like for her to have a different pain medication.   See below:     Length of Stay: 6  Current Medications: Scheduled Meds:  . budesonide (PULMICORT) nebulizer solution  0.25 mg Nebulization BID  . feeding supplement (ENSURE ENLIVE)  237 mL Oral BID BM  . hydrocortisone sod succinate (SOLU-CORTEF) inj  100 mg Intravenous Q8H  . pantoprazole (PROTONIX) IV  40 mg Intravenous Q12H    Continuous Infusions: . fentaNYL infusion INTRAVENOUS 30 mcg/hr (07/05/17 1138)  . HYDROmorphone      PRN Meds: acetaminophen **OR** acetaminophen, albuterol, antiseptic oral rinse, glycopyrrolate **OR** glycopyrrolate **OR** glycopyrrolate, haloperidol **OR** haloperidol **OR** haloperidol lactate, HYDROmorphone **AND** HYDROmorphone, LORazepam **OR** LORazepam **OR** LORazepam, LORazepam, ondansetron **OR** ondansetron (ZOFRAN) IV, polyvinyl  alcohol  Physical Exam         Opens eyes, in mild distress Shallow breathing S1 S2 Worsening generalized edema Some mottling evident in bilateral LE Abdomen more distended  Vital Signs: BP 99/62 (BP Location: Left Arm)   Pulse 78   Temp (!) 97.3 F (36.3 C) (Oral)   Resp 12   Ht 5\' 6"  (1.676 m)   Wt 79.1 kg (174 lb 6.1 oz)   SpO2 90%   BMI 28.15 kg/m  SpO2: SpO2: 90 % O2 Device: O2 Device: Not Delivered O2 Flow Rate:    Intake/output summary:   Intake/Output Summary (Last 24 hours) at 07/05/17 1154 Last data filed at 07/04/17 1500  Gross per 24 hour  Intake  71.97 ml  Output                0 ml  Net            71.97 ml   LBM: Last BM Date: 07/04/17 Baseline Weight: Weight: 72 kg (158 lb 11.7 oz) Most recent weight: Weight: 79.1 kg (174 lb 6.1 oz)       Palliative Assessment/Data:    Flowsheet Rows     Most Recent Value  Intake Tab  Referral Department  Hospitalist  Unit at Time of Referral  ICU  Palliative Care Primary Diagnosis  Cancer  Date Notified  06/30/17  Date of Admission  07/01/2017  Date first seen by Palliative Care  06/30/17  # of days Palliative referral response time  0 Day(s)  # of days IP prior to Palliative referral  1  Clinical Assessment  Palliative Performance Scale Score  30%  Pain Max last 24 hours  8  Pain Min Last 24 hours  0  Psychosocial & Spiritual Assessment  Palliative Care Outcomes      Patient Active Problem List   Diagnosis Date Noted  . Encounter for palliative care   . Goals of care, counseling/discussion   . Hypoglycemia 06/30/2017  . Sepsis ([place]) 07/04/2017  . Dehydration 07/12/2017  . Hyperkalemia 06/28/2017  . Hypotension 06/28/2017  . Transaminitis 05/17/2017  . AKI (acute kidney injury) ([place]) 05/15/2017  . Constipation 05/15/2017  . Microcytic anemia 05/15/2017  . Thrombocytosis ([place]) 05/15/2017  . Hypertension 05/15/2017  . 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 Ovarian  cancer ([place]) 01/16/2017  . Ovarian mass, right 12/26/2016  . Enlarged uterus 12/26/2016  . Liver masses 12/26/2016  . Diabetes mellitus type II, non insulin dependent ([place]) 12/26/2016    Palliative Care Assessment & Plan   Patient Profile:    Assessment:  metastatic ovarian cancer with multisystem organ failure Generalized weakness End of life care  Recommendations/Plan:  continue comfort measures Continue to support the family Appreciate chaplain and [facility] input and inter disciplinary approach to care. . Will rotate opioids to Dilaudid drip, will also have Dilaudid bolus doses available for effective analgesia, based on my discussions with family at the bedside this am.  Prognosis likely few hours to some very limited number of days at this point.   Goals of Care and Additional Recommendations:  Limitations on Scope of Treatment: Full Comfort Care  Code Status:    Code Status Orders        Start     Ordered   07/02/17 1120  Do not attempt resuscitation (DNR)  Continuous    Question Answer Comment  In the event of cardiac or respiratory ARREST Do not call a "code blue"   In the event of cardiac or respiratory ARREST Do not perform Intubation, CPR, defibrillation or ACLS   In the event of cardiac or respiratory ARREST Use medication by any route, position, wound care, and other measures to relive pain and suffering. May use oxygen, suction and manual treatment of airway obstruction as needed for comfort.      07/02/17 1122    Code Status History    Date Active Date Inactive Code Status Order ID Comments User Context   07/02/2017  9:30 AM 07/02/2017 11:22 AM DNR [id number]  Micheline Rough, MD Inpatient   07/05/2017  8:42 PM 07/02/2017  9:30 AM Full Code [id number]  Ivor Costa, MD ED   05/15/2017 11:43 PM 05/18/2017  1:34 PM Full  Code [id number]  Vianne Bulls, MD ED       Prognosis:   Hours - Days Possibly final 24 hours.   Discharge Planning:  Anticipated Hospital  Death  Care plan was discussed with  Patient's family including her husband, RN Josph Macho and Dr Rockne Menghini.   Thank you for allowing the Palliative Medicine Team to assist in the care of this patient.   Time In: 11 Time Out: 11.25 Total Time 25 Prolonged Time Billed  no       Greater than 50%  of this time was spent counseling and coordinating care related to the above assessment and plan.  Loistine Chance, MD [phone]  Please contact Palliative Medicine Team phone at [phone] for questions and concerns.

## 2017-07-05 NOTE — Progress Notes (Signed)
This RN wasted 30 mL of 10:1 fentanyl in sink

## 2017-07-05 NOTE — Progress Notes (Addendum)
[facility] [place]-GIP RN Visit @ 0805 am.   This is related and covered GIP admission from 06/30/17 to [facility] diagnosis of ovarian cancer, per Dr. Tomasa Hosteller. Patient was a FULL Code prior to admission. Patient was admitted to hospice services on 06/21/17. Patient was brought into ED for c/o weakness and poor appetite over 2 days. Hospice was not notified. Patient was admitted for treatment of sepsis.  Visited patient at bedside. Patient had her eyes open when I came in, but quickly fell asleep.  She did not respond to me being there.  Per husband at bedside, she had been in some pain earlier, but received pain medication and is now falling asleep again.  Patient with shallow breathing this morning.   Patient has dark yellow/brown urine in foley this morning with lessening output.   Patient in NAD at this time.   I spoke with husband about how he was doing and he advised okay, but tired.  He declined needing anything at this time.  Offered emotional support.   Patient receiving: Continuous Medications: fentaNYL (SUBLIMAZE) 2,500 mcg in sodium chloride 0.9% 250 mL (10 mcg/mL) infusion, Rate: 2 mL/hr, Dose 20 mcg/hr, continuous via IV. PRN medications given today thus far: fentaNYL (SUBLIMAZE) injection 25-50 mcg, Dose 50 mcg, Q1H PRN via IV at [facility], [place], 0629, 0734.  Patient is requiring more PRN doses than previously.  We will continue to monitor while patient is hospitalized.  Please call with any hospice related questions or concerns.   Thank you.  Edyth Gunnels, RN, BSN [facility] Liaison [phone]  All hospital liaisons are now on [place].

## 2017-07-05 NOTE — Progress Notes (Signed)
Progress Note    Hayley Collins  KKX:[id number] DOB: 13-Feb-1971  DOA: 07/03/2017 PCP: Nolene Ebbs, MD    Brief Narrative:   Chief complaint: Metastatic ovarian cancer with weakness and poor appetite  Medical records reviewed and are as summarized below:  Hayley Collins is an 46 y.o. female the PMH of hypertension, type 2 diabetes, and metastatic ovarian cancer who was admitted on 07/01/2017 with generalized weakness and poor oral intake. She had multi-organ failure on admission, and given her overall poor prognosis, ultimately decided to pursue full comfort measures.  Assessment/Plan:   Principal Problem:    Dehydration with hypotension/AKI , sepsis ruled out Sepsis ruled out with negative blood and urine cultures, negative chest x-ray. Initially treated with vancomycin/Zosyn for suspicion of sepsis, but hypotension, lactic acidosis and acute renal failure ultimatlyfelt to be from dehydration.  Active Problems:   Ovarian cancer with Liver masses with transaminitis Evaluated by the palliative care team and has now transitioned to full comfort measures. She is currently being managed on a fentanyl drip for pain control. She is under GIP status with an anticipated in-hospital death.    Diabetes mellitus type II, non insulin dependent (HCC)/hypoglycemia No further CBG checks or treatment indicated at this time.    Hyperkalemia Resolved with Kayexalate.   Family Communication/Anticipated D/C date and plan/Code Status   DVT prophylaxis: None in light of comfort care. Code Status: DO NOT RESUSCITATE  Family Communication: Multiple family updated at the bedside. Disposition Plan: Anticipated in-hospital death.   Medical Consultants:    Palliative Care   Anti-Infectives:    Rocephin 07/02/17---> 07/02/17  Vancomycin 07/12/2017---> 06/30/17  Zosyn 06/23/2017---> 07/02/17  Subjective:   The patient reports that her pain is currently controlled.  No nausea or vomiting.  Bowels moved this morning.  Objective:    Vitals:   07/03/17 0921 07/03/17 1950 07/04/17 0700 07/04/17 1139  BP:   99/62   Pulse:   78   Resp:   12   Temp:   (!) 97.3 F (36.3 C)   TempSrc:   Oral   SpO2: 93% 92% 96% 90%  Weight:      Height:        Intake/Output Summary (Last 24 hours) at 07/05/17 0817 Last data filed at 07/04/17 1500  Gross per 24 hour  Intake            71.97 ml  Output                0 ml  Net            71.97 ml   Filed Weights   06/30/17 1000 07/01/17 0419 07/02/17 0500  Weight: 72 kg (158 lb 11.7 oz) 74.4 kg (164 lb 0.4 oz) 79.1 kg (174 lb 6.1 oz)    Exam: General: Awake, alert. Temporal wasting/cachectic. Cardiovascular: Unchanged: Heart sounds with regular rate, and rhythm. No gallops or rubs. No murmurs. No JVD. Lungs: Unchanged: Diminished. No rales, rhonchi or wheezes. Abdomen: Unchanged: Firm/distended. Hepatomegaly with abdominal mass palpable. Neurological: Awake, minimally verbal. Taking in sips of soup.  Data Reviewed:   I have personally reviewed following labs and imaging studies:  Labs: Labs show the following: No new labs since 07/02/17.  Basic Metabolic Panel:  Recent Labs Lab 06/26/2017 1652 07/08/2017 1739 06/30/17 0357 07/01/17 0320 07/02/17 0349  NA 125* 131* 131* 128* 129*  K 5.8* 6.0* 5.0 4.5 4.3  CL 95* 105 104 97* 92*  CO2 9*  --  10* 10* 12*  GLUCOSE 74 82 100* 153* 125*  BUN 96* 102* 94* 86* 89*  CREATININE 5.07* 6.60* 4.40* 4.30* 4.68*  CALCIUM 9.4  --  8.6* 8.5* 8.4*  MG  --   --   --   --  1.7   GFR Estimated Creatinine Clearance: 15.6 mL/min (A) (by C-G formula based on SCr of 4.68 mg/dL (H)). Liver Function Tests:  Recent Labs Lab 07/01/2017 1652 06/30/17 0357 07/01/17 0320 07/02/17 0349  AST 358* 354* 358* <5*  ALT 113* 106* 112* 115*  ALKPHOS 1,811* 1,597* 1,551* 1,361*  BILITOT 39.0* 36.6* 33.7* 17.7*  PROT 6.0* 5.2* 5.3* 5.7*  ALBUMIN 2.0* 1.7* 1.8* 2.5*   Coagulation profile  Recent  Labs Lab 06/30/2017 2156 07/02/17 0349  INR 1.22 1.31    CBC:  Recent Labs Lab 07/04/2017 1652 07/08/2017 1739 06/30/17 0357 06/30/17 1842 07/01/17 0320 07/02/17 0349  WBC 18.5*  --  18.7* 16.9* 17.2* 18.5*  NEUTROABS 14.2*  --   --   --   --   --   HGB 7.7* 9.9* 6.7* 9.3* 9.3* 8.0*  HCT 26.0* 29.0* 22.5* 30.2* 29.5* 25.9*  MCV 83.6  --  83.6 85.3 83.1 82.7  PLT 385  --  351 319 297 298   CBG:  Recent Labs Lab 07/01/17 0743 07/01/17 1453 07/01/17 1744 07/01/17 2141 07/02/17 0734  GLUCAP 150* 153* 153* 138* 112*   Sepsis Labs:  Recent Labs Lab 06/22/2017 1740 06/16/2017 2156 06/30/17 0357 06/30/17 1842 07/01/17 0320 07/02/17 0349  PROCALCITON  --  2.83  --   --   --   --   WBC  --   --  18.7* 16.9* 17.2* 18.5*  LATICACIDVEN 5.07* 3.8* 4.9*  --   --   --     Microbiology Recent Results (from the past 240 hour(s))  Blood culture (routine x 2)     Status: None   Collection Time: 06/28/2017  5:35 PM  Result Value Ref Range Status   Specimen Description BLOOD RIGHT FOREARM  Final   Special Requests   Final    BOTTLES DRAWN AEROBIC AND ANAEROBIC Blood Culture adequate volume   Culture   Final    NO GROWTH 5 DAYS Performed at [facility], [place] [address] [place], [place] [address]    Report Status 07/04/2017 FINAL  Final  Blood culture (routine x 2)     Status: None   Collection Time: 07/03/2017  6:10 PM  Result Value Ref Range Status   Specimen Description BLOOD RIGHT HAND  Final   Special Requests IN PEDIATRIC BOTTLE Blood Culture adequate volume  Final   Culture   Final    NO GROWTH 5 DAYS Performed at [facility], [place] [address] [place], [place] [address]    Report Status 07/04/2017 FINAL  Final  Urine Culture     Status: None   Collection Time: 07/06/2017  9:10 PM  Result Value Ref Range Status   Specimen Description URINE, CLEAN CATCH  Final   Special Requests NONE  Final   Culture   Final    NO GROWTH Performed at [facility],  [place] [address], [place], [place] [address]    Report Status 07/01/2017 FINAL  Final  MRSA PCR Screening     Status: None   Collection Time: 06/30/17  9:50 AM  Result Value Ref Range Status   MRSA by PCR NEGATIVE NEGATIVE Final    Comment:  The GeneXpert MRSA Assay (FDA approved for NASAL specimens only), is one component of a comprehensive MRSA colonization surveillance program. It is not intended to diagnose MRSA infection nor to guide or monitor treatment for MRSA infections.   Gastrointestinal Panel by PCR , Stool     Status: None   Collection Time: 06/30/17  2:51 PM  Result Value Ref Range Status   Campylobacter species NOT DETECTED NOT DETECTED Final   Plesimonas shigelloides NOT DETECTED NOT DETECTED Final   Salmonella species NOT DETECTED NOT DETECTED Final   Yersinia enterocolitica NOT DETECTED NOT DETECTED Final   Vibrio species NOT DETECTED NOT DETECTED Final   Vibrio cholerae NOT DETECTED NOT DETECTED Final   Enteroaggregative E coli (EAEC) NOT DETECTED NOT DETECTED Final   Enteropathogenic E coli (EPEC) NOT DETECTED NOT DETECTED Final   Enterotoxigenic E coli (ETEC) NOT DETECTED NOT DETECTED Final   Shiga like toxin producing E coli (STEC) NOT DETECTED NOT DETECTED Final   Shigella/Enteroinvasive E coli (EIEC) NOT DETECTED NOT DETECTED Final   Cryptosporidium NOT DETECTED NOT DETECTED Final   Cyclospora cayetanensis NOT DETECTED NOT DETECTED Final   Entamoeba histolytica NOT DETECTED NOT DETECTED Final   Giardia lamblia NOT DETECTED NOT DETECTED Final   Adenovirus F40/41 NOT DETECTED NOT DETECTED Final   Astrovirus NOT DETECTED NOT DETECTED Final   Norovirus GI/GII NOT DETECTED NOT DETECTED Final   Rotavirus A NOT DETECTED NOT DETECTED Final   Sapovirus (I, II, IV, and V) NOT DETECTED NOT DETECTED Final    Procedures and diagnostic studies:  No results found.  Medications:   . budesonide (PULMICORT) nebulizer solution  0.25 mg Nebulization BID  . feeding  supplement (ENSURE ENLIVE)  237 mL Oral BID BM  . hydrocortisone sod succinate (SOLU-CORTEF) inj  100 mg Intravenous Q8H  . pantoprazole (PROTONIX) IV  40 mg Intravenous Q12H   Continuous Infusions: . fentaNYL infusion INTRAVENOUS 20 mcg/hr (07/02/17 1600)     LOS: 6 days   RAMA,CHRISTINA  [facility] Pager [phone]. If unable to reach me by pager, please call my cell phone at [phone].  *Please refer to [email or web address], password [email or web address] to get updated schedule on who will round on this patient, as hospitalists switch teams weekly. If 7PM-7AM, please contact night-coverage at [email or web address], password [email or web address] for any overnight needs.  07/05/2017, 8:17 AM

## 2017-07-06 NOTE — Progress Notes (Signed)
Daily Progress Note   Patient Name: Hayley Collins       Date: 07/06/2017 DOB: Sep 25, 1971  Age: 46 y.o. MRN#: [id number] Attending Physician: Rosita Fire, MD Primary Care Physician: Nolene Ebbs, MD Admit Date: 06/16/2017  Reason for Consultation/Follow-up: Terminal Care  Life limiting disease: metastatic ovarian cancer with multisystem organ failure.  Subjective: Unresponsive but comfortable Husband at bedside In no distress On Dilaudid drip now  [facility] following  See below:     Length of Stay: 7  Current Medications: Scheduled Meds:  . budesonide (PULMICORT) nebulizer solution  0.25 mg Nebulization BID  . feeding supplement (ENSURE ENLIVE)  237 mL Oral BID BM  . hydrocortisone sod succinate (SOLU-CORTEF) inj  100 mg Intravenous Q8H  . pantoprazole (PROTONIX) IV  40 mg Intravenous Q12H    Continuous Infusions: . fentaNYL infusion INTRAVENOUS 30 mcg/hr (07/05/17 1138)  . HYDROmorphone 1 mg/hr (07/06/17 0550)    PRN Meds: acetaminophen **OR** acetaminophen, albuterol, antiseptic oral rinse, glycopyrrolate **OR** glycopyrrolate **OR** glycopyrrolate, haloperidol **OR** haloperidol **OR** haloperidol lactate, HYDROmorphone **AND** HYDROmorphone, LORazepam **OR** LORazepam **OR** LORazepam, LORazepam, ondansetron **OR** ondansetron (ZOFRAN) IV, polyvinyl alcohol  Physical Exam         Opens eyes, in mild distress Shallow breathing S1 S2 Worsening generalized edema Some mottling evident in bilateral LE Abdomen more distended Unresponsive now  Vital Signs: BP (!) 88/54 (BP Location: Left Arm) Comment: rn was told [name]  Pulse 73   Temp 97.6 F (36.4 C) (Oral)   Resp 14   Ht 5\' 6"  (1.676 m)   Wt 79.1 kg (174 lb 6.1 oz)   SpO2 94%   BMI 28.15 kg/m  SpO2:  SpO2: 94 % O2 Device: O2 Device: Not Delivered O2 Flow Rate: O2 Flow Rate (L/min): 2 L/min  Intake/output summary:   Intake/Output Summary (Last 24 hours) at 07/06/17 1518 Last data filed at 07/05/17 2051  Gross per 24 hour  Intake                0 ml  Output               50 ml  Net              -50 ml   LBM: Last BM Date: 07/05/17 Baseline Weight: Weight: 72 kg (158 lb 11.7  oz) Most recent weight: Weight: 79.1 kg (174 lb 6.1 oz)       Palliative Assessment/Data:    Flowsheet Rows     Most Recent Value  Intake Tab  Referral Department  Hospitalist  Unit at Time of Referral  ICU  Palliative Care Primary Diagnosis  Cancer  Date Notified  06/30/17  Date of Admission  07/10/2017  Date first seen by Palliative Care  06/30/17  # of days Palliative referral response time  0 Day(s)  # of days IP prior to Palliative referral  1  Clinical Assessment  Palliative Performance Scale Score  30%  Pain Max last 24 hours  8  Pain Min Last 24 hours  0  Psychosocial & Spiritual Assessment  Palliative Care Outcomes      Patient Active Problem List   Diagnosis Date Noted  . Generalized pain   . Encounter for palliative care   . Goals of care, counseling/discussion   . Hypoglycemia 06/30/2017  . Sepsis ([place]) 06/22/2017  . Dehydration 07/10/2017  . Hyperkalemia 07/13/2017  . Hypotension 06/23/2017  . Transaminitis 05/17/2017  . AKI (acute kidney injury) ([place]) 05/15/2017  . Constipation 05/15/2017  . Microcytic anemia 05/15/2017  . Thrombocytosis ([place]) 05/15/2017  . Hypertension 05/15/2017  . 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 Ovarian cancer ([place]) 01/16/2017  . Ovarian mass, right 12/26/2016  . Enlarged uterus 12/26/2016  . Liver masses 12/26/2016  . Diabetes mellitus type II, non insulin dependent ([place]) 12/26/2016    Palliative Care Assessment & Plan   Patient Profile:    Assessment:  metastatic ovarian cancer with multisystem organ  failure Generalized weakness End of life care  Recommendations/Plan:  continue comfort measures Continue to support the family Appreciate chaplain and [facility] input and inter disciplinary approach to care. . Will continue Dilaudid drip, will also have Dilaudid bolus doses available for effective analgesia, based on my discussions with family at the bedside   Prognosis likely few hours to some very limited number of days at this point.   Goals of Care and Additional Recommendations:  Limitations on Scope of Treatment: Full Comfort Care  Code Status:    Code Status Orders        Start     Ordered   07/02/17 1120  Do not attempt resuscitation (DNR)  Continuous    Question Answer Comment  In the event of cardiac or respiratory ARREST Do not call a "code blue"   In the event of cardiac or respiratory ARREST Do not perform Intubation, CPR, defibrillation or ACLS   In the event of cardiac or respiratory ARREST Use medication by any route, position, wound care, and other measures to relive pain and suffering. May use oxygen, suction and manual treatment of airway obstruction as needed for comfort.      07/02/17 1122    Code Status History    Date Active Date Inactive Code Status Order ID Comments User Context   07/02/2017  9:30 AM 07/02/2017 11:22 AM DNR [id number]  Micheline Rough, MD Inpatient   06/26/2017  8:42 PM 07/02/2017  9:30 AM Full Code [id number]  Ivor Costa, MD ED   05/15/2017 11:43 PM 05/18/2017  1:34 PM Full Code [id number]  Vianne Bulls, MD ED       Prognosis:   Hours - Days Possibly final 24 hours.   Discharge Planning:  Anticipated Hospital Death  Care plan was discussed with  Patient's family including her husband, RN Josph Macho and Dr Rockne Menghini.  Thank you for allowing the Palliative Medicine Team to assist in the care of this patient.   Time In: 1455 Time Out: 1520 Total Time 25 Prolonged Time Billed  no       Greater than 50%  of this time was spent counseling and  coordinating care related to the above assessment and plan.  Loistine Chance, MD [phone]  Please contact Palliative Medicine Team phone at [phone] for questions and concerns.

## 2017-07-06 NOTE — Progress Notes (Signed)
[facility]-GIP RN Visit @ 0820 am.  This is related and covered GIP admission from 06/30/17 to [facility] diagnosis of ovarian cancer, per Dr. Tomasa Hosteller. Patient was a FULL Code prior to admission, she is now a DNR. Patient was admitted to hospice services on 06/21/17. Patient was brought into ED for c/o weakness and poor appetite over 2 days. Hospice was not notified. Patient was admitted for treatment of sepsis.  Spoke with patient and husband at bedside. Patient is minimally responsive to voice, opens eyes, non verbal. She does not appear to be in pain or distress. Husband states her pain is better controlled with new medication. Foley catheter is draining very dark urine. I asked husband if there was anything he needed and he stated not at this time. Offered emotional support.   Continuous medications: Dilaudid 25mg /80ml @ 45ml/hr-1mg /hr.  PRN medications today: Dilaudid 0.5mg  Q48min IV PRN @0712  and 0750, Ativan 1mg  IV Q4hrs PRN @ 0752    Please contact with any hospice-related questions or concerns.  Please contact [facility] at [phone] at time of death.  Thank you,  Farrel Gordon. RN, [facility] Liaison [phone]  All hospital liaisons now available on [place].

## 2017-07-06 NOTE — Progress Notes (Signed)
PROGRESS NOTE    Hayley Collins  ZOX:[id number] DOB: 1971/09/09 DOA: 07/13/2017 PCP: Nolene Ebbs, MD   Brief Narrative: 46 y.o. female the PMH of hypertension, type 2 diabetes, and metastatic ovarian cancer who was admitted on 06/17/2017 with generalized weakness and poor oral intake. She had multi-organ failure on admission, and given her overall poor prognosis, ultimately decided to pursue full comfort measures.  Assessment & Plan:  # Ovarian cancer with Liver masses with transaminitis Evaluated by the palliative care team and has now transitioned to full comfort measures. She is currently being managed on a fentanyl and dilaudid drip for pain management. She is under GIP status with an anticipated in-hospital death. Patient looked comfortable this morning. Discussed with patient husband at bedside.  #Dehydration with hypotension/AKI , sepsis ruled out Sepsis ruled out with negative blood and urine cultures, negative chest x-ray. Initially treated with vancomycin/Zosyn for suspicion of sepsis, but hypotension, lactic acidosis and acute renal failure ultimatlyfelt to be from dehydration.  # Diabetes mellitus type II, non insulin dependent (HCC)/hypoglycemia No further CBG checks or treatment indicated at this time.  #Hyperkalemia Resolved with Kayexalate.   # Hyponatremia; now comfort care  Principal Problem:   Sepsis ([place]) Active Problems:   Liver masses   Diabetes mellitus type II, non insulin dependent ([place])   Ovarian cancer ([place])   AKI (acute kidney injury) ([place])   Transaminitis   Dehydration   Hyperkalemia   Hypotension   Hypoglycemia   Encounter for palliative care   Goals of care, counseling/discussion   Generalized pain  DVT prophylaxis:comfort care Code Status:DNR Family Communication: Patient's husband at bedside Disposition Plan: Anticipated in-hospital death    Consultants:   Palliative care  Antimicrobials:  Rocephin 07/02/17--->  07/02/17  Vancomycin 06/28/2017---> 06/30/17  Zosyn 07/01/2017---> 07/02/17 Subjective: Patient was seen and examined at bedside. She was calm and comfortable. Patient's husband at bedside  Objective: Vitals:   07/04/17 0700 07/04/17 1139 07/05/17 2100 07/06/17 0815  BP: 99/62  98/62 (!) 88/54  Pulse: 78  67 73  Resp: 12  14 14   Temp: (!) 97.3 F (36.3 C)  97.7 F (36.5 C) 97.6 F (36.4 C)  TempSrc: Oral  Axillary Oral  SpO2: 96% 90% 98% 94%  Weight:      Height:        Intake/Output Summary (Last 24 hours) at 07/06/17 1521 Last data filed at 07/05/17 2051  Gross per 24 hour  Intake                0 ml  Output               50 ml  Net              -50 ml   Filed Weights   06/30/17 1000 07/01/17 0419 07/02/17 0500  Weight: 72 kg (158 lb 11.7 oz) 74.4 kg (164 lb 0.4 oz) 79.1 kg (174 lb 6.1 oz)    Examination:  General exam: Ill-looking cachectic female lying on bed looks comfortable  Respiratory system: Clear to auscultation. Respiratory effort normal.  Cardiovascular system: S1 & S2 heard, RRR.  No pedal edema. Gastrointestinal system: Abdomen is nondistended, soft and nontender.   Data Reviewed: I have personally reviewed following labs and imaging studies  CBC:  Recent Labs Lab 06/28/2017 1652 07/01/2017 1739 06/30/17 0357 06/30/17 1842 07/01/17 0320 07/02/17 0349  WBC 18.5*  --  18.7* 16.9* 17.2* 18.5*  NEUTROABS 14.2*  --   --   --   --   --  HGB 7.7* 9.9* 6.7* 9.3* 9.3* 8.0*  HCT 26.0* 29.0* 22.5* 30.2* 29.5* 25.9*  MCV 83.6  --  83.6 85.3 83.1 82.7  PLT 385  --  351 319 297 235   Basic Metabolic Panel:  Recent Labs Lab 07/03/2017 1652 06/20/2017 1739 06/30/17 0357 07/01/17 0320 07/02/17 0349  NA 125* 131* 131* 128* 129*  K 5.8* 6.0* 5.0 4.5 4.3  CL 95* 105 104 97* 92*  CO2 9*  --  10* 10* 12*  GLUCOSE 74 82 100* 153* 125*  BUN 96* 102* 94* 86* 89*  CREATININE 5.07* 6.60* 4.40* 4.30* 4.68*  CALCIUM 9.4  --  8.6* 8.5* 8.4*  MG  --   --   --   --  1.7    GFR: Estimated Creatinine Clearance: 15.6 mL/min (A) (by C-G formula based on SCr of 4.68 mg/dL (H)). Liver Function Tests:  Recent Labs Lab 06/26/2017 1652 06/30/17 0357 07/01/17 0320 07/02/17 0349  AST 358* 354* 358* <5*  ALT 113* 106* 112* 115*  ALKPHOS 1,811* 1,597* 1,551* 1,361*  BILITOT 39.0* 36.6* 33.7* 17.7*  PROT 6.0* 5.2* 5.3* 5.7*  ALBUMIN 2.0* 1.7* 1.8* 2.5*   No results for input(s): LIPASE, AMYLASE in the last 168 hours. No results for input(s): AMMONIA in the last 168 hours. Coagulation Profile:  Recent Labs Lab 07/01/2017 2156 07/02/17 0349  INR 1.22 1.31   Cardiac Enzymes: No results for input(s): CKTOTAL, CKMB, CKMBINDEX, TROPONINI in the last 168 hours. BNP (last 3 results) No results for input(s): PROBNP in the last 8760 hours. HbA1C: No results for input(s): HGBA1C in the last 72 hours. CBG:  Recent Labs Lab 07/01/17 0743 07/01/17 1453 07/01/17 1744 07/01/17 2141 07/02/17 0734  GLUCAP 150* 153* 153* 138* 112*   Lipid Profile: No results for input(s): CHOL, HDL, LDLCALC, TRIG, CHOLHDL, LDLDIRECT in the last 72 hours. Thyroid Function Tests: No results for input(s): TSH, T4TOTAL, FREET4, T3FREE, THYROIDAB in the last 72 hours. Anemia Panel: No results for input(s): VITAMINB12, FOLATE, FERRITIN, TIBC, IRON, RETICCTPCT in the last 72 hours. Sepsis Labs:  Recent Labs Lab 07/15/2017 1740 07/03/2017 2156 06/30/17 0357  PROCALCITON  --  2.83  --   LATICACIDVEN 5.07* 3.8* 4.9*    Recent Results (from the past 240 hour(s))  Blood culture (routine x 2)     Status: None   Collection Time: 06/20/2017  5:35 PM  Result Value Ref Range Status   Specimen Description BLOOD RIGHT FOREARM  Final   Special Requests   Final    BOTTLES DRAWN AEROBIC AND ANAEROBIC Blood Culture adequate volume   Culture   Final    NO GROWTH 5 DAYS Performed at [facility], [address], [place], [place] [address]    Report Status 07/04/2017 FINAL  Final  Blood  culture (routine x 2)     Status: None   Collection Time: 06/28/2017  6:10 PM  Result Value Ref Range Status   Specimen Description BLOOD RIGHT HAND  Final   Special Requests IN PEDIATRIC BOTTLE Blood Culture adequate volume  Final   Culture   Final    NO GROWTH 5 DAYS Performed at [facility], [place] [address], [place], [place] [address]    Report Status 07/04/2017 FINAL  Final  Urine Culture     Status: None   Collection Time: 06/23/2017  9:10 PM  Result Value Ref Range Status   Specimen Description URINE, CLEAN CATCH  Final   Special Requests NONE  Final   Culture   Final    NO GROWTH Performed at [facility], [place] [address] [place], [place] [address]    Report Status 07/01/2017 FINAL  Final  MRSA PCR Screening     Status: None   Collection Time: 06/30/17  9:50 AM  Result Value Ref Range Status   MRSA by PCR NEGATIVE NEGATIVE Final    Comment:        The GeneXpert MRSA Assay (FDA approved for NASAL specimens only), is one component of a comprehensive MRSA colonization surveillance program. It is not intended to diagnose MRSA infection nor to guide or monitor treatment for MRSA infections.   Gastrointestinal Panel by PCR , Stool     Status: None   Collection Time: 06/30/17  2:51 PM  Result Value Ref Range Status   Campylobacter species NOT DETECTED NOT DETECTED Final   Plesimonas shigelloides NOT DETECTED NOT DETECTED Final   Salmonella species NOT DETECTED NOT DETECTED Final   Yersinia enterocolitica NOT DETECTED NOT DETECTED Final   Vibrio species NOT DETECTED NOT DETECTED Final   Vibrio cholerae NOT DETECTED NOT DETECTED Final   Enteroaggregative E coli (EAEC) NOT DETECTED NOT DETECTED Final   Enteropathogenic E coli (EPEC) NOT DETECTED NOT DETECTED Final   Enterotoxigenic E coli (ETEC) NOT DETECTED NOT DETECTED Final   Shiga like toxin producing E coli (STEC) NOT DETECTED NOT DETECTED Final   Shigella/Enteroinvasive E coli (EIEC) NOT DETECTED NOT  DETECTED Final   Cryptosporidium NOT DETECTED NOT DETECTED Final   Cyclospora cayetanensis NOT DETECTED NOT DETECTED Final   Entamoeba histolytica NOT DETECTED NOT DETECTED Final   Giardia lamblia NOT DETECTED NOT DETECTED Final   Adenovirus F40/41 NOT DETECTED NOT DETECTED Final   Astrovirus NOT DETECTED NOT DETECTED Final   Norovirus GI/GII NOT DETECTED NOT DETECTED Final   Rotavirus A NOT DETECTED NOT DETECTED Final   Sapovirus (I, II, IV, and V) NOT DETECTED NOT DETECTED Final         Radiology Studies: No results found.      Scheduled Meds: . budesonide (PULMICORT) nebulizer solution  0.25 mg Nebulization BID  . feeding supplement (ENSURE ENLIVE)  237 mL Oral BID BM  . hydrocortisone sod succinate (SOLU-CORTEF) inj  100 mg Intravenous Q8H  . pantoprazole (PROTONIX) IV  40 mg Intravenous Q12H   Continuous Infusions: . fentaNYL infusion INTRAVENOUS 30 mcg/hr (07/05/17 1138)  . HYDROmorphone 1 mg/hr (07/06/17 0550)     LOS: 7 days    Dron Tanna Furry, MD [facility] Pager [phone]  If 7PM-7AM, please contact night-coverage [email or web address] Password [email or web address] 07/06/2017, 3:21 PM

## 2017-07-06 NOTE — Progress Notes (Signed)
[facility] Liaison  Received call from [facility] with PMT.  She advised that Dr. Rowe Pavy would like [place] to contact our chaplain services to go see patient/family.  I have left a message for chaplain and also contact [facility] CSW to advise of same.   Thank you,  Edyth Gunnels, RN, [facility] Liaison [phone]

## 2017-07-16 NOTE — Death Summary Note (Addendum)
Death Summary  ABBIE BERLING QQI:[id number] DOB: 1971-09-16 DOA: 07-18-17  PCP: Nolene Ebbs, MD  Admit date: 07-18-2017 Date of Death: 2017-07-26 Time of Death: 04:15 AM Notification: Nolene Ebbs, MD notified of death of 26-Jul-2017   History of present illness:  46 y.o.femalethe PMH of hypertension, type 2 diabetes, and metastatic ovarian cancer who was admitted on 2017-07-18 with generalized weakness and poor oral intake. She had multi-organ failure on admission, and given her overall poor prognosis, ultimately decided to pursue full comfort measures. Patient was DNR/DNI and on comfort measures. Patient passed away in around 04:15 AM.   Ovarian cancer with liver masses with transaminitis Dehydration with hypotension/acute kidney injury, sepsis ruled out Diabetes type 2 with hypoglycemia Hyperkalemia Hyponatremia Severe malnutrition  The results of significant diagnostics from this hospitalization (including imaging, microbiology, ancillary and laboratory) are listed below for reference.    Significant Diagnostic Studies: Ct Abdomen Pelvis Wo Contrast  Result Date: 07/01/2017 CLINICAL DATA:  46 year old female with abdominal pain and distention. End-stage metastatic ovarian cancer. EXAM: CT ABDOMEN AND PELVIS WITHOUT CONTRAST TECHNIQUE: Multidetector CT imaging of the abdomen and pelvis was performed following the standard protocol without IV contrast. COMPARISON:  05/15/2017 abdominal CT. FINDINGS: Please note that parenchymal abnormalities may be missed without intravenous contrast. Lower chest: Trace bilateral pleural effusions are now noted with mild right basilar atelectasis. Small right middle lobe and lower lobe nodules have slightly enlarged. Hepatobiliary: Markedly enlargement of the liver is again noted with innumerable hepatic masses, which appear increased in size from the prior study no significant gallbladder abnormality. Pancreas: No definite abnormality Spleen:  Unremarkable Adrenals/Urinary Tract: The kidneys and adrenal glands are unremarkable. A Foley catheter is present within the bladder. Stomach/Bowel: No bowel obstruction or definite inflammatory changes. Vascular/Lymphatic: No abdominal aortic aneurysm. Mildly enlarged pelvic and retroperitoneal lymph nodes are unchanged. Reproductive: Uterine/right adnexal masses are unchanged. Other: A small to moderate amount of ascites within the abdomen and pelvis now noted. Diffuse subcutaneous edema is present. There is no evidence of pneumoperitoneum. Musculoskeletal: No acute abnormality or suspicious bony lesion. IMPRESSION: 1. Increasing marked hepatomegaly with apparent enlargement of widespread hepatic metastases. 2. New small to moderate amount of ascites, trace bilateral pleural effusions and diffuse subcutaneous edema 3. Slightly enlarging right middle lobe and right lower lobe pulmonary nodules/metastases. Mild right basilar atelectasis. 4. Uterine/right adnexal masses are unchanged. Electronically Signed   By: Margarette Canada M.D.   On: 07/01/2017 14:09   US Renal  Result Date: Jul 18, 2017 CLINICAL DATA:  Initial evaluation for acute renal injury. EXAM: RENAL / URINARY TRACT ULTRASOUND COMPLETE COMPARISON:  Prior CT from 05/15/2017. FINDINGS: Right Kidney: Length: 14.3 cm. Increased echogenicity within the renal parenchyma. No mass or hydronephrosis visualized. Left Kidney: Length: 13.6 cm. Increased echogenicity within the renal parenchyma. No mass or hydronephrosis visualized. Bladder: Appears normal for degree of bladder distention. Trace free fluid noted within the abdomen. IMPRESSION: 1. Increased echogenicity within the renal parenchyma bilaterally, compatible with medical renal disease. No hydronephrosis. 2. Trace free fluid within the abdomen. Electronically Signed   By: Jeannine Boga M.D.   On: 07/18/17 21:31   Dg Chest Port 1 View  Result Date: 07/18/2017 CLINICAL DATA:  Weakness and poor  appetite. Shortness of breath over the last 2 days. EXAM: PORTABLE CHEST 1 VIEW COMPARISON:  04/30/2017 FINDINGS: Poor inspiration. Heart size is normal. Mediastinal shadows normal. The lungs are clear. The vascularity is normal. No effusions. No acute bone finding. IMPRESSION: Poor inspiration.  Allowing for  that, no active disease suspected. Electronically Signed   By: Nelson Chimes M.D.   On: 06/28/2017 17:58    Microbiology: Recent Results (from the past 240 hour(s))  Blood culture (routine x 2)     Status: None   Collection Time: 07/15/2017  5:35 PM  Result Value Ref Range Status   Specimen Description BLOOD RIGHT FOREARM  Final   Special Requests   Final    BOTTLES DRAWN AEROBIC AND ANAEROBIC Blood Culture adequate volume   Culture   Final    NO GROWTH 5 DAYS Performed at [facility], [address], [place], [place] [address]    Report Status 07/04/2017 FINAL  Final  Blood culture (routine x 2)     Status: None   Collection Time: 07/11/2017  6:10 PM  Result Value Ref Range Status   Specimen Description BLOOD RIGHT HAND  Final   Special Requests IN PEDIATRIC BOTTLE Blood Culture adequate volume  Final   Culture   Final    NO GROWTH 5 DAYS Performed at [facility], [place] [address] [place], [place] [address]    Report Status 07/04/2017 FINAL  Final  Urine Culture     Status: None   Collection Time: 07/12/2017  9:10 PM  Result Value Ref Range Status   Specimen Description URINE, CLEAN CATCH  Final   Special Requests NONE  Final   Culture   Final    NO GROWTH Performed at [facility], [place] [address], [place],  [address]    Report Status 07/01/2017 FINAL  Final  MRSA PCR Screening     Status: None   Collection Time: 06/30/17  9:50 AM  Result Value Ref Range Status   MRSA by PCR NEGATIVE NEGATIVE Final    Comment:        The GeneXpert MRSA Assay (FDA approved for NASAL specimens only), is one component of a comprehensive MRSA colonization surveillance  program. It is not intended to diagnose MRSA infection nor to guide or monitor treatment for MRSA infections.   Gastrointestinal Panel by PCR , Stool     Status: None   Collection Time: 06/30/17  2:51 PM  Result Value Ref Range Status   Campylobacter species NOT DETECTED NOT DETECTED Final   Plesimonas shigelloides NOT DETECTED NOT DETECTED Final   Salmonella species NOT DETECTED NOT DETECTED Final   Yersinia enterocolitica NOT DETECTED NOT DETECTED Final   Vibrio species NOT DETECTED NOT DETECTED Final   Vibrio cholerae NOT DETECTED NOT DETECTED Final   Enteroaggregative E coli (EAEC) NOT DETECTED NOT DETECTED Final   Enteropathogenic E coli (EPEC) NOT DETECTED NOT DETECTED Final   Enterotoxigenic E coli (ETEC) NOT DETECTED NOT DETECTED Final   Shiga like toxin producing E coli (STEC) NOT DETECTED NOT DETECTED Final   Shigella/Enteroinvasive E coli (EIEC) NOT DETECTED NOT DETECTED Final   Cryptosporidium NOT DETECTED NOT DETECTED Final   Cyclospora cayetanensis NOT DETECTED NOT DETECTED Final   Entamoeba histolytica NOT DETECTED NOT DETECTED Final   Giardia lamblia NOT DETECTED NOT DETECTED Final   Adenovirus F40/41 NOT DETECTED NOT DETECTED Final   Astrovirus NOT DETECTED NOT DETECTED Final   Norovirus GI/GII NOT DETECTED NOT DETECTED Final   Rotavirus A NOT DETECTED NOT DETECTED Final   Sapovirus (I, II, IV, and V) NOT DETECTED NOT DETECTED Final     Labs: Basic Metabolic Panel:  Recent Labs Lab 07/01/17 0320 07/02/17 0349  NA 128* 129*  K  4.5 4.3  CL 97* 92*  CO2 10* 12*  GLUCOSE 153* 125*  BUN 86* 89*  CREATININE 4.30* 4.68*  CALCIUM 8.5* 8.4*  MG  --  1.7   Liver Function Tests:  Recent Labs Lab 07/01/17 0320 07/02/17 0349  AST 358* <5*  ALT 112* 115*  ALKPHOS 1,551* 1,361*  BILITOT 33.7* 17.7*  PROT 5.3* 5.7*  ALBUMIN 1.8* 2.5*   No results for input(s): LIPASE, AMYLASE in the last 168 hours. No results for input(s): AMMONIA in the last 168  hours. CBC:  Recent Labs Lab 06/30/17 1842 07/01/17 0320 07/02/17 0349  WBC 16.9* 17.2* 18.5*  HGB 9.3* 9.3* 8.0*  HCT 30.2* 29.5* 25.9*  MCV 85.3 83.1 82.7  PLT 319 297 298   Cardiac Enzymes: No results for input(s): CKTOTAL, CKMB, CKMBINDEX, TROPONINI in the last 168 hours. D-Dimer No results for input(s): DDIMER in the last 72 hours. BNP: Invalid input(s): POCBNP CBG:  Recent Labs Lab 07/01/17 0743 07/01/17 1453 07/01/17 1744 07/01/17 2141 07/02/17 0734  GLUCAP 150* 153* 153* 138* 112*   Anemia work up No results for input(s): VITAMINB12, FOLATE, FERRITIN, TIBC, IRON, RETICCTPCT in the last 72 hours. Urinalysis    Component Value Date/Time   COLORURINE AMBER (A) 07/01/2017 2110   APPEARANCEUR HAZY (A) 06/17/2017 2110   LABSPEC 1.010 06/22/2017 2110   PHURINE 5.0 07/03/2017 2110   GLUCOSEU NEGATIVE 06/16/2017 2110   HGBUR MODERATE (A) 07/06/2017 2110   BILIRUBINUR MODERATE (A) 07/15/2017 2110   KETONESUR NEGATIVE 06/27/2017 2110   PROTEINUR 30 (A) 07/09/2017 2110   UROBILINOGEN 0.2 05/10/2013 1631   NITRITE NEGATIVE 06/28/2017 2110   LEUKOCYTESUR NEGATIVE 06/25/2017 2110   Sepsis Labs Invalid input(s): PROCALCITONIN,  WBC,  LACTICIDVEN     SIGNED:  Rosita Fire, MD  [facility] 08-04-2017, 12:09 PM Pager   If 7PM-7AM, please contact night-coverage [email or web address] Password [email or web address]

## 2017-07-16 NOTE — Death Summary Note (Signed)
On July 31, 2017, Saturday morning, patient, Hayley Collins, passed away at approximately 04:15am. Nurses verified time of death and MD on call was also notified.

## 2017-07-16 DEATH — deceased

## 2018-02-12 IMAGING — DX DG CHEST 2V
2 series · 2 of 2 positions shown · non-contrast
Comparison: 03/16/2017 CXR, chest CT 03/19/2017

CLINICAL DATA: Stage IV ovarian cancer spread to liver now having
upper quadrant pain radiating to the back. Jaundice.

EXAM:
CHEST  2 VIEW

[chest lat]
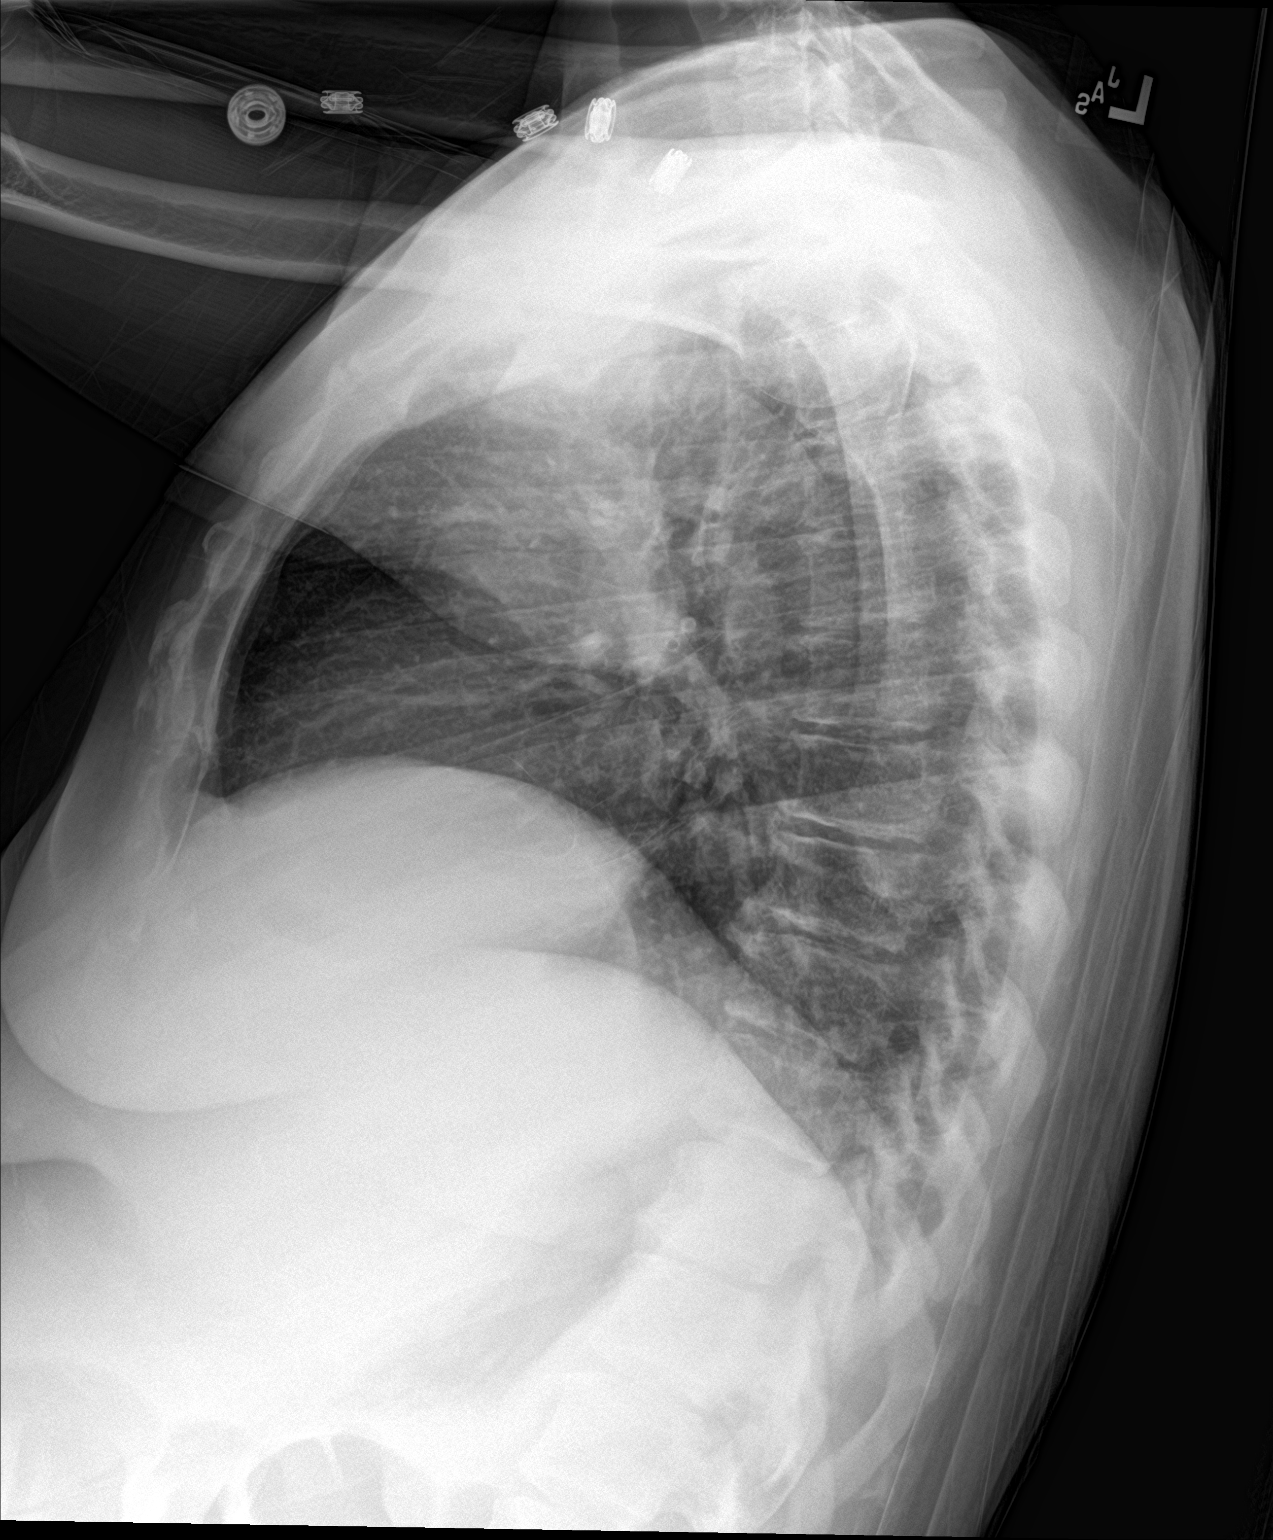

[chest ap]
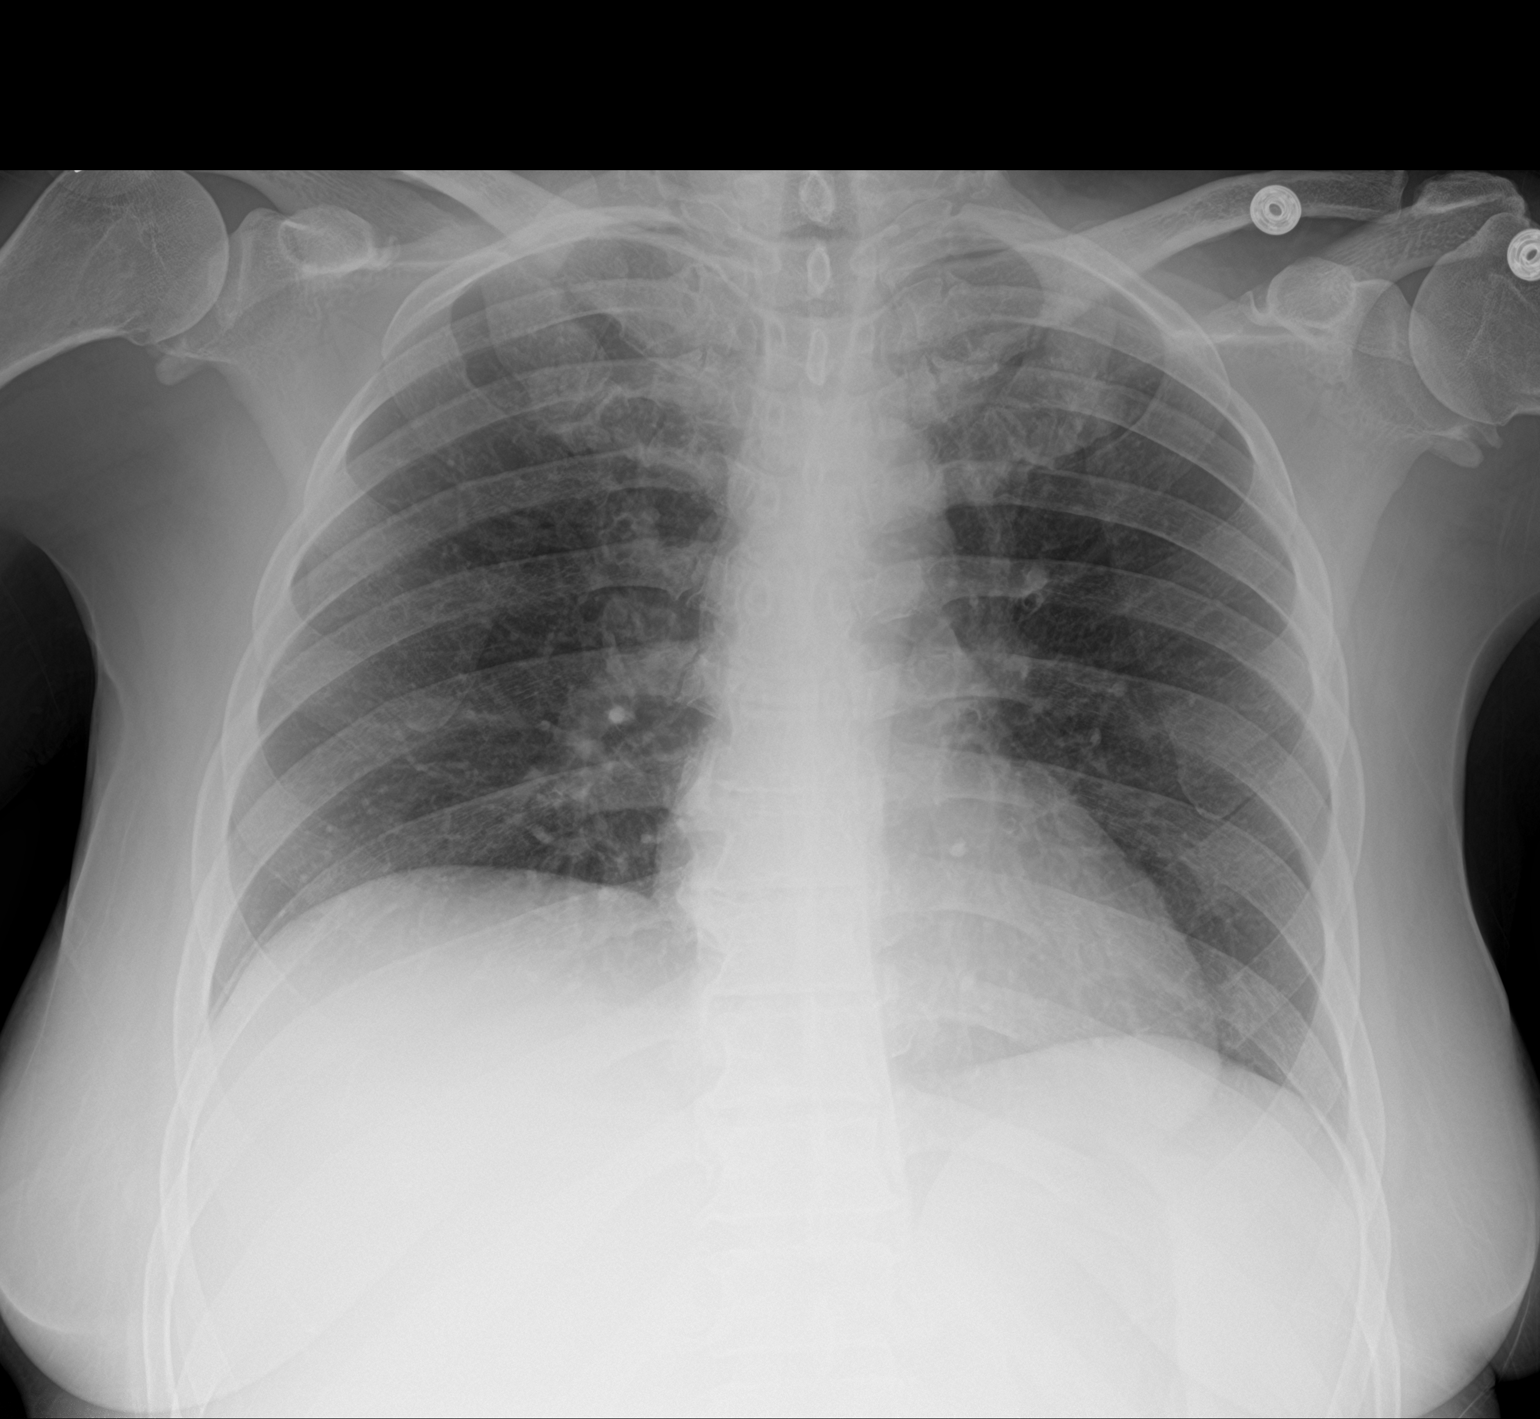

[2 of 2 positions shown; findings below may reference images not displayed]

FINDINGS: The heart size and mediastinal contours are within normal limits.
Both lungs are clear. Chronic mild elevation of right hemidiaphragm.
No acute nor suspicious osseous lesions. Osteophytes noted off the
inferior glenoid rim bilaterally.
IMPRESSION: No active cardiopulmonary disease.

## 2018-02-27 IMAGING — CT CT ABD-PELV W/O CM
2 of 4 series · 15 of 46 positions shown, 17 images · non-contrast
Comparison: CT Abdomen and Pelvis 04/16/2017, and earlier

CLINICAL DATA: 46 y/o female with metastatic adenocarcinoma, GYN
origin suspected on pathology analysis of liver biopsy. Pain with no
bowel movement for 3 weeks. Jaundice. Renal insufficiency.

EXAM:
CT ABDOMEN AND PELVIS WITHOUT CONTRAST
TECHNIQUE: Multidetector CT imaging of the abdomen and pelvis was performed
following the standard protocol without IV contrast.

[Series 2: axial st · axial · 0.74mm/px · z∈[-1053,-598]mm · 12 of 101 slices shown, 14 images]
[im 5/101  soft-tissue]
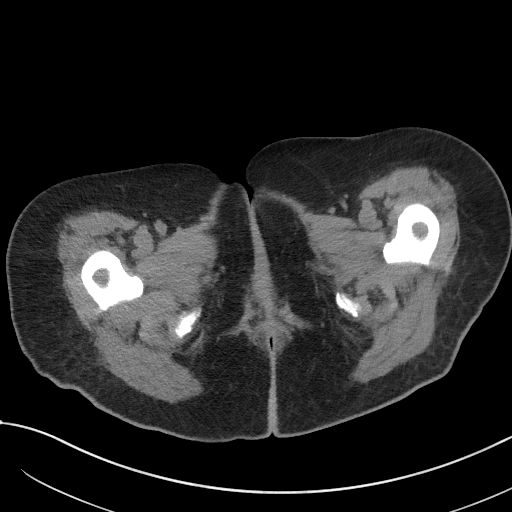
[im 5/101  bone]
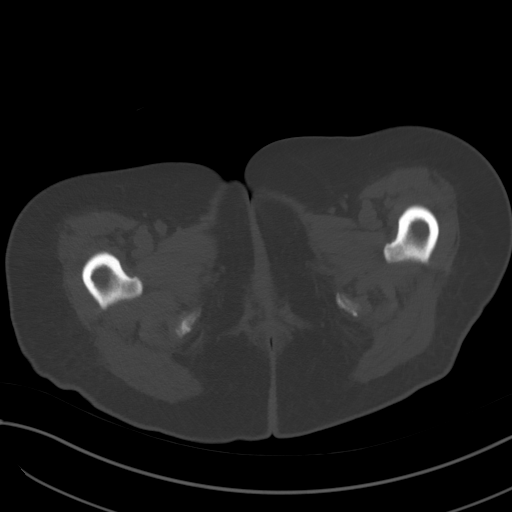
[im 14/101  soft-tissue]
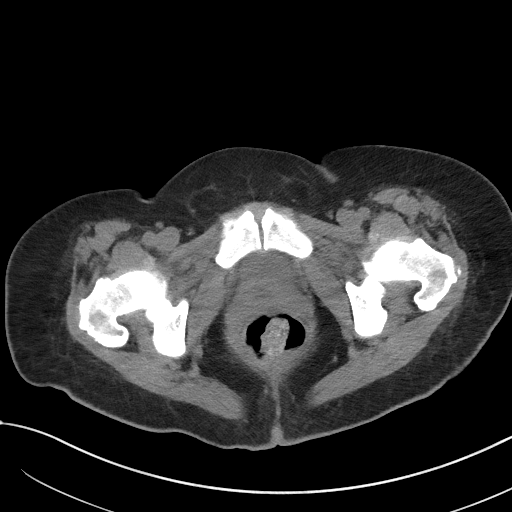
[im 23/101  soft-tissue]
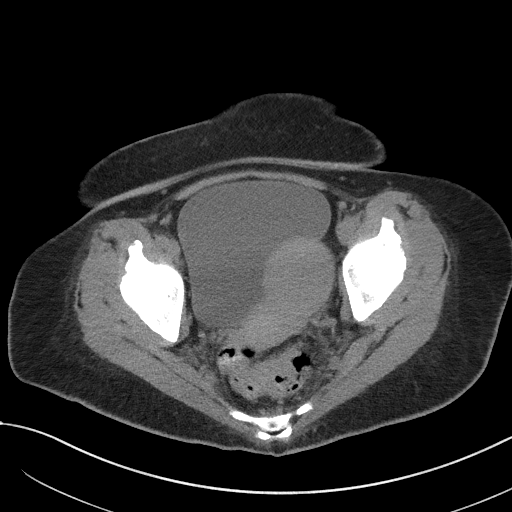
[im 32/101  soft-tissue]
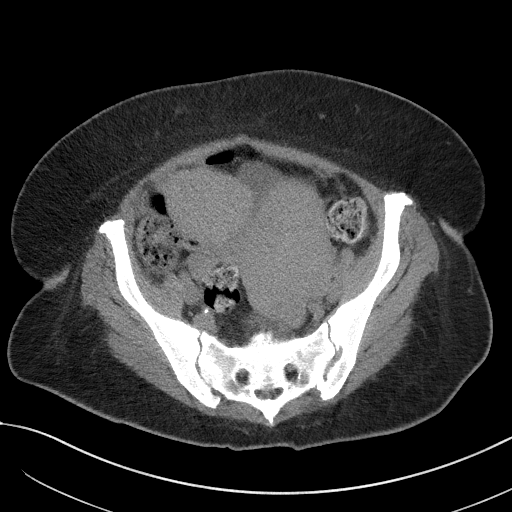
[im 37/101  soft-tissue]
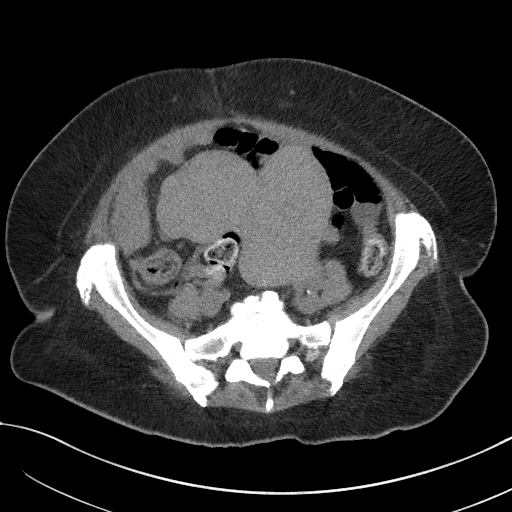
[im 46/101  soft-tissue]
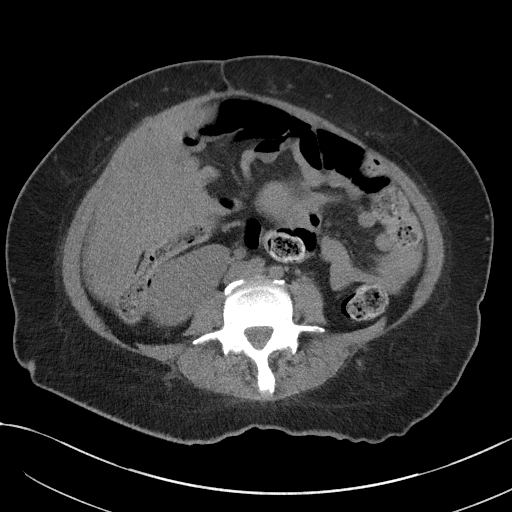
[im 55/101  soft-tissue]
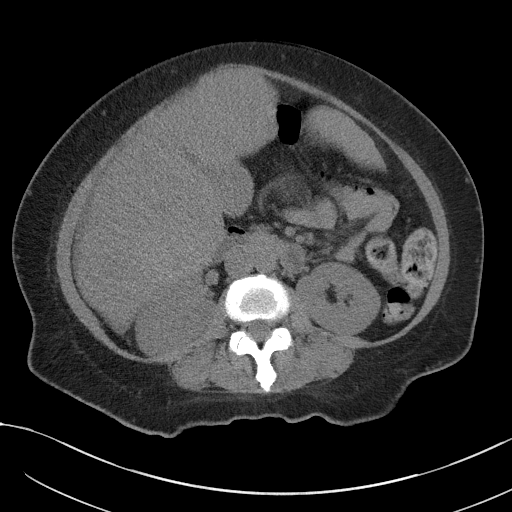
[im 64/101  soft-tissue]
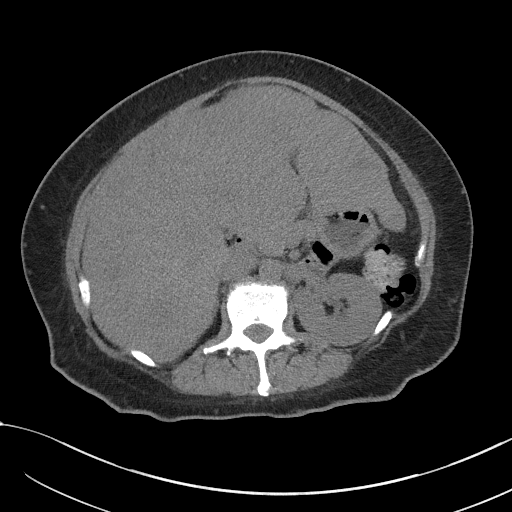
[im 69/101  soft-tissue]
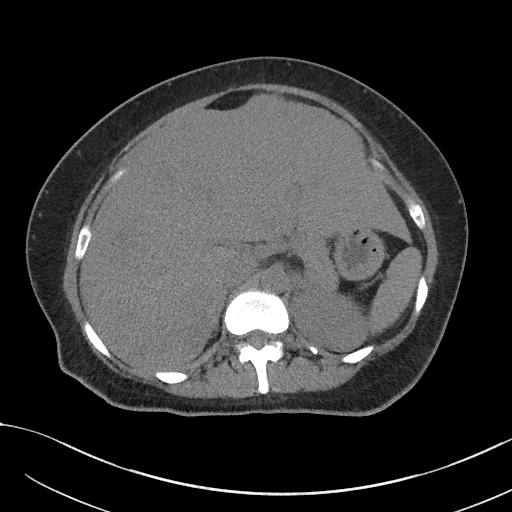
[im 69/101  bone]
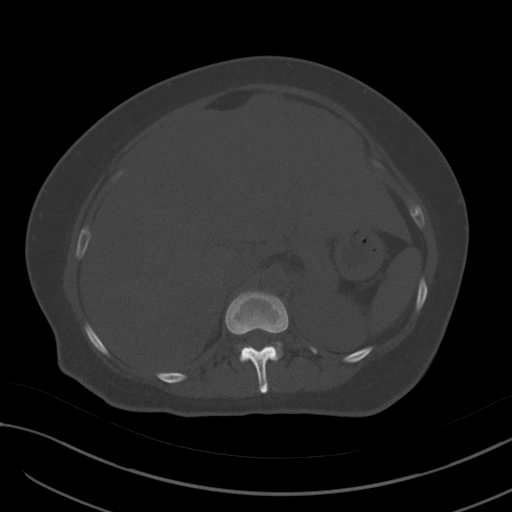
[im 78/101  soft-tissue]
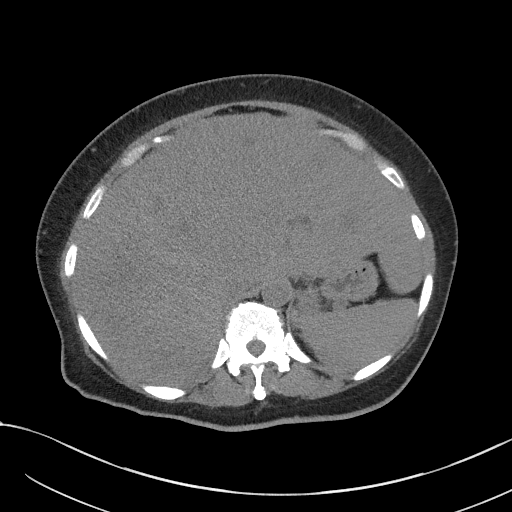
[im 87/101  soft-tissue]
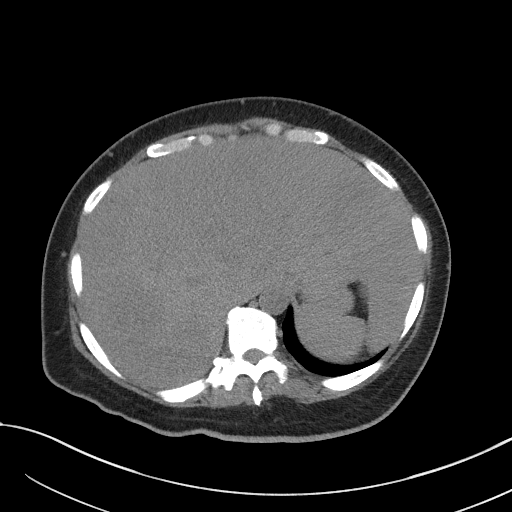
[im 96/101  soft-tissue]
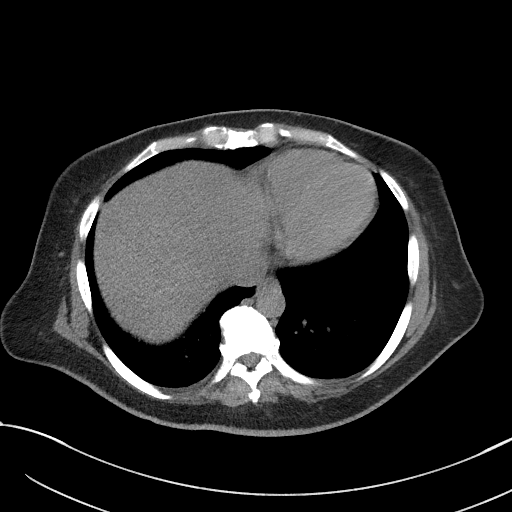

[Series 4: coronal st · coronal · 0.89mm/px · 3 of 101 slices shown]
[im 34/101  soft-tissue]
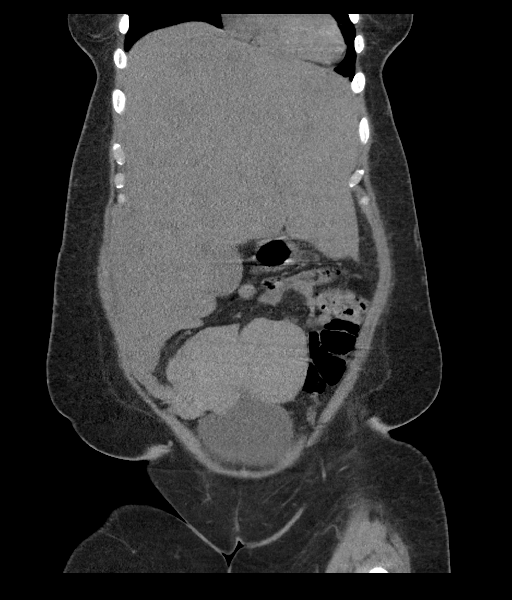
[im 45/101  soft-tissue]
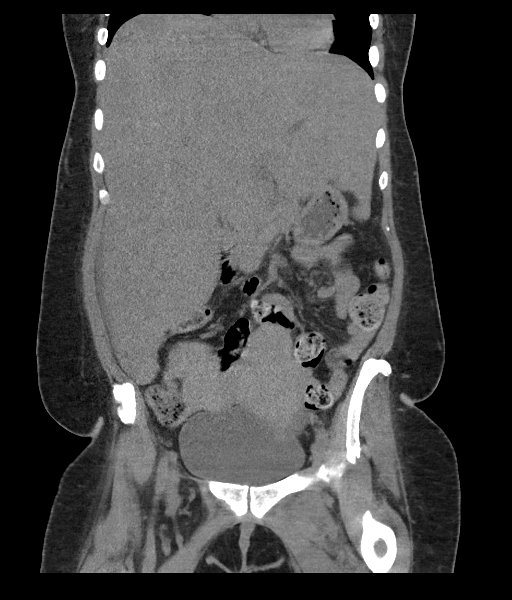
[im 56/101  soft-tissue]
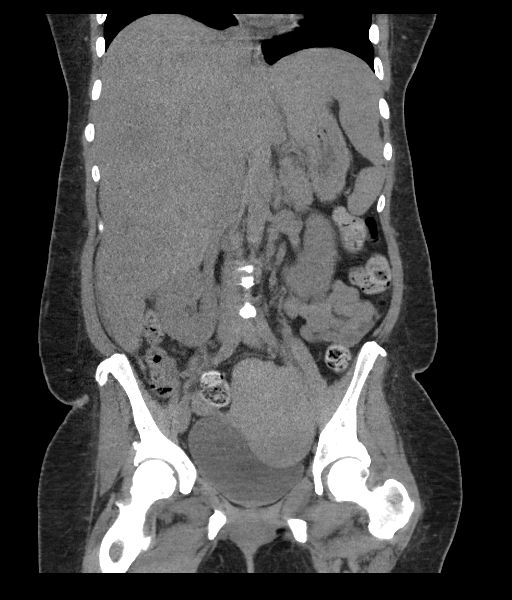

[15 of 46 positions shown; findings below may reference images not displayed]

FINDINGS: Lower chest: Lung bases are stable an negative. No pericardial or
pleural effusion.

Hepatobiliary: Moderate to severe hepatomegaly (liver greater than
33 cm in length) with widespread hepatic metastases which were
better demonstrated with the use of IV contrast on the prior
studies. Trace perihepatic free fluid along the right lobe (series
2, image 52). Grossly negative gallbladder.

Pancreas: No biliary ductal enlargement is evident. Negative
noncontrast pancreas.

Spleen: Negative noncontrast spleen.

Adrenals/Urinary Tract: Negative noncontrast adrenal glands and
kidneys. No hydronephrosis. No hydroureter is evident, although the
course of both ureters is difficult to delineate.

Moderately dilated but otherwise unremarkable urinary bladder.

Stomach/Bowel: Moderate volume of retained stool in the colon.
Regional mass effect including on the bowel from the enlarged liver
and uterus/adnexa. This is most pronounced at the hepatic flexure
where the colon is relatively compressed between the liver and
kidney (series 2, image 55). No dilated small bowel. Negative
stomach. No definite mesenteric free fluid. No free air.

Vascular/Lymphatic: Vascular patency is not evaluated in the absence
of IV contrast. No lymphadenopathy is evident in the noncontrast
abdomen or pelvis.

Reproductive: Lobulated uterine and right adnexal region soft tissue
masses re- demonstrated. Noncontrast appearance is stable compared
to 04/16/2017. See series 2, image 66 and 74. There is some regional
mass effect noted, including on the sigmoid colon, although this is
relatively mild. No distal large bowel compression is identified.

Other: No pelvic free fluid.

Musculoskeletal: No acute or suspicious osseous lesion identified.
IMPRESSION: 1. Moderate to severe hepatomegaly with extensive liver metastases.
Lobulated uterine and right adnexal enlargement appear stable since
04/16/2017.
[DATE]. Up to moderate retained stool in the colon but no evidence of
mechanical bowel obstruction, although there is some mass effect on
the large bowel related to #1 - most pronounced at the hepatic
flexure.
3. No new metastatic disease identified in the absence of IV
contrast.
